# Patient Record
Sex: Female | Born: 1965 | Race: Black or African American | Hispanic: No | Marital: Single | Smoking: Current every day smoker
Health system: Southern US, Community
[De-identification: ages and names within clinical notes are randomized; demographics above are authoritative.]

## PROBLEM LIST (undated history)

## (undated) DIAGNOSIS — Z860100 Personal history of colon polyps, unspecified: Secondary | ICD-10-CM

## (undated) DIAGNOSIS — G709 Myoneural disorder, unspecified: Secondary | ICD-10-CM

## (undated) DIAGNOSIS — K219 Gastro-esophageal reflux disease without esophagitis: Secondary | ICD-10-CM

## (undated) DIAGNOSIS — H9192 Unspecified hearing loss, left ear: Secondary | ICD-10-CM

## (undated) DIAGNOSIS — E559 Vitamin D deficiency, unspecified: Secondary | ICD-10-CM

## (undated) DIAGNOSIS — E538 Deficiency of other specified B group vitamins: Secondary | ICD-10-CM

## (undated) DIAGNOSIS — IMO0001 Reserved for inherently not codable concepts without codable children: Secondary | ICD-10-CM

## (undated) HISTORY — DX: Vitamin D deficiency, unspecified: E55.9

## (undated) HISTORY — DX: Deficiency of other specified B group vitamins: E53.8

## (undated) HISTORY — PX: BUNIONECTOMY: SHX129

## (undated) HISTORY — PX: BREAST EXCISIONAL BIOPSY: SUR124

## (undated) HISTORY — PX: EYE SURGERY: SHX253

## (undated) HISTORY — PX: TUBAL LIGATION: SHX77

---

## 2004-01-18 ENCOUNTER — Ambulatory Visit: Payer: Self-pay | Admitting: Gastroenterology

## 2005-08-12 ENCOUNTER — Ambulatory Visit: Payer: Self-pay | Admitting: *Deleted

## 2005-09-08 ENCOUNTER — Ambulatory Visit: Payer: Self-pay | Admitting: *Deleted

## 2006-09-05 ENCOUNTER — Emergency Department: Payer: Self-pay | Admitting: Emergency Medicine

## 2006-09-07 ENCOUNTER — Emergency Department: Payer: Self-pay | Admitting: Emergency Medicine

## 2007-01-17 ENCOUNTER — Ambulatory Visit: Payer: Self-pay | Admitting: Family Medicine

## 2007-02-08 ENCOUNTER — Ambulatory Visit: Payer: Self-pay | Admitting: Gastroenterology

## 2007-11-04 ENCOUNTER — Emergency Department: Payer: Self-pay | Admitting: Emergency Medicine

## 2007-12-02 ENCOUNTER — Emergency Department: Payer: Self-pay | Admitting: Internal Medicine

## 2008-04-26 ENCOUNTER — Ambulatory Visit: Payer: Self-pay | Admitting: Family Medicine

## 2008-06-13 ENCOUNTER — Emergency Department: Payer: Self-pay | Admitting: Unknown Physician Specialty

## 2011-04-07 HISTORY — PX: FOOT ARTHROPLASTY: SHX1657

## 2011-04-07 HISTORY — PX: BREAST BIOPSY: SHX20

## 2011-07-28 ENCOUNTER — Ambulatory Visit: Payer: Self-pay

## 2011-08-24 ENCOUNTER — Ambulatory Visit: Payer: Self-pay | Admitting: General Surgery

## 2011-08-24 LAB — HCG, QUANTITATIVE, PREGNANCY: Beta Hcg, Quant.: <1 m[IU]/mL — ABNORMAL LOW

## 2011-08-25 LAB — PATHOLOGY REPORT

## 2011-08-27 DIAGNOSIS — H0230 Blepharochalasis unspecified eye, unspecified eyelid: Secondary | ICD-10-CM | POA: Insufficient documentation

## 2011-10-05 ENCOUNTER — Emergency Department: Payer: Self-pay | Admitting: Unknown Physician Specialty

## 2011-10-10 ENCOUNTER — Emergency Department: Payer: Self-pay | Admitting: Emergency Medicine

## 2011-11-17 ENCOUNTER — Ambulatory Visit: Payer: Self-pay | Admitting: Internal Medicine

## 2011-11-23 ENCOUNTER — Ambulatory Visit: Payer: Self-pay

## 2013-09-08 ENCOUNTER — Emergency Department: Payer: Self-pay | Admitting: Emergency Medicine

## 2013-09-09 LAB — BASIC METABOLIC PANEL
Anion Gap: 5 — ABNORMAL LOW (ref 7–16)
BUN: 8 mg/dL (ref 7–18)
CREATININE: 0.71 mg/dL (ref 0.60–1.30)
Calcium, Total: 9.1 mg/dL (ref 8.5–10.1)
Chloride: 108 mmol/L — ABNORMAL HIGH (ref 98–107)
Co2: 26 mmol/L (ref 21–32)
EGFR (African American): >60
EGFR (Non-African Amer.): >60
Glucose: 80 mg/dL (ref 65–99)
Osmolality: 275 (ref 275–301)
Potassium: 4.2 mmol/L (ref 3.5–5.1)
SODIUM: 139 mmol/L (ref 136–145)

## 2013-09-09 LAB — CBC WITH DIFFERENTIAL/PLATELET
Basophil #: 0.1 10*3/uL (ref 0.0–0.1)
Basophil %: 0.7 %
Eosinophil #: 0.3 10*3/uL (ref 0.0–0.7)
Eosinophil %: 2.6 %
HCT: 42.6 % (ref 35.0–47.0)
HGB: 13.8 g/dL (ref 12.0–16.0)
LYMPHS ABS: 2.6 10*3/uL (ref 1.0–3.6)
LYMPHS PCT: 22.9 %
MCH: 30.5 pg (ref 26.0–34.0)
MCHC: 32.3 g/dL (ref 32.0–36.0)
MCV: 95 fL (ref 80–100)
MONO ABS: 1.2 x10 3/mm — AB (ref 0.2–0.9)
MONOS PCT: 10.4 %
Neutrophil #: 7.1 10*3/uL — ABNORMAL HIGH (ref 1.4–6.5)
Neutrophil %: 63.4 %
PLATELETS: 346 10*3/uL (ref 150–440)
RBC: 4.51 10*6/uL (ref 3.80–5.20)
RDW: 13.9 % (ref 11.5–14.5)
WBC: 11.2 10*3/uL — ABNORMAL HIGH (ref 3.6–11.0)

## 2013-09-11 LAB — BETA STREP CULTURE(ARMC)

## 2013-11-04 ENCOUNTER — Emergency Department: Payer: Self-pay | Admitting: Emergency Medicine

## 2013-11-04 LAB — COMPREHENSIVE METABOLIC PANEL
ALT: 26 U/L
ANION GAP: 9 (ref 7–16)
Albumin: 3.6 g/dL (ref 3.4–5.0)
Alkaline Phosphatase: 77 U/L
BUN: 6 mg/dL — ABNORMAL LOW (ref 7–18)
Bilirubin,Total: 0.2 mg/dL (ref 0.2–1.0)
Calcium, Total: 8.8 mg/dL (ref 8.5–10.1)
Chloride: 111 mmol/L — ABNORMAL HIGH (ref 98–107)
Co2: 22 mmol/L (ref 21–32)
Creatinine: 0.84 mg/dL (ref 0.60–1.30)
EGFR (Non-African Amer.): >60
Glucose: 95 mg/dL (ref 65–99)
Osmolality: 281 (ref 275–301)
POTASSIUM: 4.1 mmol/L (ref 3.5–5.1)
SGOT(AST): 37 U/L (ref 15–37)
SODIUM: 142 mmol/L (ref 136–145)
TOTAL PROTEIN: 8 g/dL (ref 6.4–8.2)

## 2013-11-04 LAB — TSH: Thyroid Stimulating Horm: 0.68 u[IU]/mL

## 2013-11-04 LAB — CBC
HCT: 44 % (ref 35.0–47.0)
HGB: 15 g/dL (ref 12.0–16.0)
MCH: 32.2 pg (ref 26.0–34.0)
MCHC: 34 g/dL (ref 32.0–36.0)
MCV: 95 fL (ref 80–100)
PLATELETS: 267 10*3/uL (ref 150–440)
RBC: 4.64 10*6/uL (ref 3.80–5.20)
RDW: 15.3 % — ABNORMAL HIGH (ref 11.5–14.5)
WBC: 7.3 10*3/uL (ref 3.6–11.0)

## 2013-11-04 LAB — GC/CHLAMYDIA PROBE AMP

## 2013-11-04 LAB — MAGNESIUM: Magnesium: 2.1 mg/dL

## 2013-11-04 LAB — WET PREP, GENITAL

## 2013-11-04 LAB — HCG, QUANTITATIVE, PREGNANCY: Beta Hcg, Quant.: <1 m[IU]/mL — ABNORMAL LOW

## 2014-07-29 NOTE — Op Note (Signed)
PATIENT NAME:  Mariah Espinoza, Mariah Espinoza MR#:  [id number] DATE OF BIRTH:  06/30/1965  DATE OF PROCEDURE:  08/24/2011  PREOPERATIVE DIAGNOSES:  1. Right eyelid cyst.  2. Chronic right breast abscess.   POSTOPERATIVE DIAGNOSES:  1. Right eyelid cyst.  2. Chronic right breast abscess.   OPERATIVE PROCEDURE:  1. Excision of sebaceous cyst of the right eyelid.  2. Excision of left retroareolar ductal network.   SURGEON: Donnalee CurryJeffrey Jacques Fife, MD   ANESTHESIA: General by LMA under Dr. Rubye OaksPalmer, 26 mL 0.5% Marcaine plain.   ESTIMATED BLOOD LOSS: Minimal.   CLINICAL NOTE: This 49 year old woman has developed Espinoza significant fascial cyst about 1.3 cm in diameter producing drooping of the right eyelid. She has also had Espinoza chronic sinus formation under the left nipple with Espinoza new area of drainage in the six o'clock position. She was felt to be Espinoza candidate for excision of the retroareolar ductal network.   OPERATIVE NOTE: With the patient under adequate general anesthesia, the eyelid lesion was approached first. The area was prepped with Espinoza Betadine solution and draped. Through Espinoza small skin line incision the sebaceous cyst and its wall was excised. The defect was closed with interrupted 5-0 nylon sutures.   The left breast was then prepped with ChloraPrep and draped. The sinus at the three o'clock position at the edge of the areola was excised through an elliptical incision as part of the circumareolar incision that ran from the 2 to 6 o'clock position. The skin was incised sharply and hemostasis achieved with electrocautery. There was Espinoza very small drop of purulent material. The retroareolar tissue was primarily fibrotic. This was excised sharply and with cautery dissection. Hemostasis was with electrocautery. The sinus that had developed 2 cm below the edge of the areola at the six o'clock position was probed and found to be in continuity with the retroareolar area. This was debrided sharply. After achieving hemostasis  the deep breast tissue was approximated with interrupted 2-0 Vicryl figure-of-eight sutures. Espinoza quarter-inch Penrose drain was placed through the sinus at the six o'clock position. The skin incision was closed loosely with interrupted 4-0 Vicryl subcuticular sutures. Telfa followed by Espinoza fluff gauze dressing, Kerlix, and Ace wrap was applied. The drain was sutured to the fluffed gauze.   The patient tolerated the procedure well and was taken to the recovery room stable condition.     ____________________________ Earline MayotteJeffrey W. Desree Leap, MD jwb:bjt D: 08/25/2011 07:39:16 ET T: 08/25/2011 10:00:56 ET JOB#: [id number]  cc: Earline MayotteJeffrey W. Ardice Boyan, MD, <Dictator> Heidi Dachebecca O. Darliss Cheneyrendorff, MD Blima SingerAnn Shaver, RN Alontae Chaloux Brion AlimentW Andrzej Scully MD ELECTRONICALLY SIGNED 08/25/2011 23:31

## 2014-10-23 ENCOUNTER — Other Ambulatory Visit: Payer: Self-pay | Admitting: Family Medicine

## 2014-10-23 DIAGNOSIS — Z872 Personal history of diseases of the skin and subcutaneous tissue: Secondary | ICD-10-CM

## 2014-10-23 DIAGNOSIS — Z1231 Encounter for screening mammogram for malignant neoplasm of breast: Secondary | ICD-10-CM

## 2014-10-25 ENCOUNTER — Ambulatory Visit: Payer: Self-pay

## 2014-10-25 ENCOUNTER — Other Ambulatory Visit: Payer: Self-pay | Admitting: Family Medicine

## 2014-10-25 ENCOUNTER — Ambulatory Visit
Admission: RE | Admit: 2014-10-25 | Discharge: 2014-10-25 | Disposition: A | Discharge status: Home / Self Care | Payer: BC Managed Care – PPO | Source: Ambulatory Visit | Attending: Family Medicine | Admitting: Family Medicine

## 2014-10-25 DIAGNOSIS — L905 Scar conditions and fibrosis of skin: Secondary | ICD-10-CM | POA: Insufficient documentation

## 2014-10-25 DIAGNOSIS — Z872 Personal history of diseases of the skin and subcutaneous tissue: Secondary | ICD-10-CM

## 2014-10-25 DIAGNOSIS — Z1231 Encounter for screening mammogram for malignant neoplasm of breast: Secondary | ICD-10-CM

## 2014-10-31 DIAGNOSIS — E538 Deficiency of other specified B group vitamins: Secondary | ICD-10-CM | POA: Insufficient documentation

## 2014-10-31 DIAGNOSIS — IMO0002 Reserved for concepts with insufficient information to code with codable children: Secondary | ICD-10-CM | POA: Insufficient documentation

## 2014-10-31 DIAGNOSIS — E559 Vitamin D deficiency, unspecified: Secondary | ICD-10-CM | POA: Insufficient documentation

## 2014-11-05 ENCOUNTER — Other Ambulatory Visit: Payer: Self-pay | Admitting: Family Medicine

## 2014-11-05 DIAGNOSIS — N924 Excessive bleeding in the premenopausal period: Secondary | ICD-10-CM

## 2014-11-06 ENCOUNTER — Ambulatory Visit
Admission: RE | Admit: 2014-11-06 | Discharge: 2014-11-06 | Disposition: A | Discharge status: Home / Self Care | Payer: BC Managed Care – PPO | Source: Ambulatory Visit | Attending: Family Medicine | Admitting: Family Medicine

## 2014-11-06 DIAGNOSIS — N924 Excessive bleeding in the premenopausal period: Secondary | ICD-10-CM

## 2014-11-06 DIAGNOSIS — N926 Irregular menstruation, unspecified: Secondary | ICD-10-CM | POA: Insufficient documentation

## 2015-05-09 ENCOUNTER — Ambulatory Visit (INDEPENDENT_AMBULATORY_CARE_PROVIDER_SITE_OTHER): Payer: BC Managed Care – PPO | Admitting: Surgery

## 2015-05-09 ENCOUNTER — Ambulatory Visit
Admission: EM | Admit: 2015-05-09 | Discharge: 2015-05-09 | Disposition: A | Discharge status: Home / Self Care | Payer: BC Managed Care – PPO | Attending: Family Medicine | Admitting: Family Medicine

## 2015-05-09 ENCOUNTER — Encounter: Payer: Self-pay | Admitting: Surgery

## 2015-05-09 VITALS — BP 158/98 | HR 100 | Temp 98.0°F | Ht 63.0 in | Wt 200.0 lb

## 2015-05-09 DIAGNOSIS — J309 Allergic rhinitis, unspecified: Secondary | ICD-10-CM | POA: Insufficient documentation

## 2015-05-09 DIAGNOSIS — N611 Abscess of the breast and nipple: Secondary | ICD-10-CM

## 2015-05-09 DIAGNOSIS — L732 Hidradenitis suppurativa: Secondary | ICD-10-CM | POA: Insufficient documentation

## 2015-05-09 DIAGNOSIS — K2100 Gastro-esophageal reflux disease with esophagitis, without bleeding: Secondary | ICD-10-CM | POA: Insufficient documentation

## 2015-05-09 DIAGNOSIS — Z87898 Personal history of other specified conditions: Secondary | ICD-10-CM | POA: Insufficient documentation

## 2015-05-09 DIAGNOSIS — E782 Mixed hyperlipidemia: Secondary | ICD-10-CM | POA: Insufficient documentation

## 2015-05-09 HISTORY — DX: Gastro-esophageal reflux disease without esophagitis: K21.9

## 2015-05-09 NOTE — ED Notes (Addendum)
Started 3-4 days ago with soreness on left aerola at the 12 oclock mark. Increased in size with now cellulitis approxiamately 3 inches in size with a white core. "Ruptured and drained itself today at 1pm today". Saw PMD yesterday and put on Doxycycline. Started antibiotic today. Previous abscess same spot in 2013. Drained at the [facility]

## 2015-05-09 NOTE — ED Provider Notes (Signed)
CSN: [id number]     Arrival date & time 05/09/15  1346 History   First MD Initiated Contact with Patient 05/09/15 1503    Nurses notes were reviewed. Chief Complaint  Patient presents with  . Abscess   Left breast abscess started 4 days ago Sunday. Apparently yesterday she was concerned and she called to get medication and her PCP called in doxycycline which she don't get filled this morning she started taking one dose of antibiotic    Patient smokes family medical history not pertinent for this problem. She does have GERD. She is allergic to Septra and penicillin.  (Consider location/radiation/quality/duration/timing/severity/associated sxs/prior Treatment) Patient is a 50 y.o. female presenting with abscess. The history is provided by the patient.  Abscess Location:  Torso Torso abscess location: Left breast. 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 GERD (gastroesophageal reflux disease)    Past Surgical History  Procedure Laterality Date  . Breast biopsy Left 2013    neg   History reviewed. No pertinent family history. Social History  Substance Use Topics  . Smoking status: Current Every Day Smoker -- 0.50 packs/day    Types: Cigarettes  . Smokeless tobacco: None  . Alcohol Use: Yes     Comment: socially   OB History    No data available     Review of Systems  Constitutional: Negative for fever.  Gastrointestinal: Negative for nausea and vomiting.  All other systems reviewed and are negative.   Allergies  Penicillins and Septra  Home Medications   Prior to Admission medications   Medication Sig  Start Date End Date Taking? Authorizing Provider  calcium-vitamin D (OSCAL WITH D) 500-200 MG-UNIT tablet Take 1 tablet by mouth.   Yes Historical Provider, MD  doxycycline (DORYX) 100 MG EC tablet Take 100 mg by mouth 2 (two) times daily.   Yes Historical Provider, MD  omeprazole (PRILOSEC) 20 MG capsule Take 20 mg by mouth daily.   Yes Historical Provider, MD   Meds Ordered and Administered this Visit  Medications - No data to display  BP 150/87 mmHg  Pulse 100  Temp(Src) 96.8 F (36 C) (Tympanic)  Resp 16  Ht  (1.6 m)  Wt 199 lb (90.266 kg)  BMI 35.26 kg/m2  SpO2 98%  LMP 04/25/2015 (Approximate) No data found.   Physical Exam  Constitutional: She is oriented to person, place, and time. She appears well-developed and well-nourished.  HENT:  Head: Normocephalic and atraumatic.  Neck: Neck supple.  Neurological: She is alert and oriented to person, place, and time.  Skin: Skin is warm and dry. Rash noted.     Abscess over the lower left breast  Psychiatric: She has a normal mood and affect.    ED Course  Procedures (including critical care time)  Labs Review Labs Reviewed - No data to display  Imaging Review No results found.   Visual Acuity Review  Right Eye Distance:   Left Eye Distance:   Bilateral Distance:    Right Eye Near:   Left Eye Near:    Bilateral Near:  MDM   1. Left breast abscess    Will send patient to [facility] surgical group. She cannot give me the name of the surgeon who she saw 3 years ago when she is going to general anesthesia for treatment of breast abscess. She's only taken 1 dose of the doxycycline which explained to her 2 little too late to try to correct this abscess. Note: This dictation was prepared with Dragon dictation along with smaller phrase technology. Any transcriptional errors that result from this process are unintentional.    Hassan Rowan, MD 05/09/15 1525

## 2015-05-09 NOTE — Discharge Instructions (Signed)
Abscess °An abscess (boil or furuncle) is an infected area on or under the skin. This area is filled with yellowish-white fluid (pus) and other material (debris). °HOME CARE  °· Only take medicines as told by your doctor. °· If you were given antibiotic medicine, take it as directed. Finish the medicine even if you start to feel better. °· If gauze is used, follow your doctor's directions for changing the gauze. °· To avoid spreading the infection: °¨ Keep your abscess covered with a bandage. °¨ Wash your hands well. °¨ Do not share personal care items, towels, or whirlpools with others. °¨ Avoid skin contact with others. °· Keep your skin and clothes clean around the abscess. °· Keep all doctor visits as told. °GET HELP RIGHT AWAY IF:  °· You have more pain, puffiness (swelling), or redness in the wound site. °· You have more fluid or blood coming from the wound site. °· You have muscle aches, chills, or you feel sick. °· You have a fever. °MAKE SURE YOU:  °· Understand these instructions. °· Will watch your condition. °· Will get help right away if you are not doing well or get worse. °  °This information is not intended to replace advice given to you by your health care provider. Make sure you discuss any questions you have with your health care provider. °  °Document Released: 09/09/2007 Document Revised: 09/22/2011 Document Reviewed: 06/06/2011 °Elsevier Interactive Patient Education ©2016 [employer]vier Inc[employer] ° °

## 2015-05-09 NOTE — Progress Notes (Signed)
Surgical Consultation  05/09/2015  Mariah Espinoza is an 50 y.o. female.   [id number] abscess  HPI: pt from UC with subareolar abscess asked to be seen urgently.She had a prev abscess in same breast years ago, drained in OR. This has been present for one week . It spont drained this morning and feels better, and she has started one dose of doxycycline this morning. Denies f/c. Not diabetic  Past Medical History  Diagnosis Date  . GERD (gastroesophageal reflux disease)     Past Surgical History  Procedure Laterality Date  . Breast biopsy Left 2013    neg    No family history on file.  Social History:  reports that she has been smoking Cigarettes.  She has been smoking about 0.50 packs per day. She does not have any smokeless tobacco history on file. She reports that she drinks alcohol. Her drug history is not on file.  Allergies:  Allergies  Allergen Reactions  . Penicillins Hives  . Septra [Sulfamethoxazole-Trimethoprim] Nausea And Vomiting    Medications reviewed.   Review of Systems:   Review of Systems  Constitutional: Negative.   HENT: Negative.   Eyes: Negative.   Respiratory: Negative.   Cardiovascular: Negative.   Gastrointestinal: Negative.   Genitourinary: Negative.   Musculoskeletal: Negative.   Skin: Negative.   Neurological: Negative.   Endo/Heme/Allergies: Negative.   Psychiatric/Behavioral: Negative.      Physical Exam:  LMP 04/25/2015 (Approximate)  Physical Exam  Constitutional: She is oriented to person, place, and time and well-developed, well-nourished, and in no distress. No distress.  HENT:  Head: Normocephalic and atraumatic.  Eyes: Pupils are equal, round, and reactive to light. Right eye exhibits no discharge. Left eye exhibits no discharge. No scleral icterus.  Neck: Normal range of motion.  Cardiovascular: Normal rate, regular rhythm and normal heart sounds.   Pulmonary/Chest: Effort normal.  Abdominal: Soft. She exhibits  no distension. There is no tenderness. There is no rebound.  Musculoskeletal: Normal range of motion. She exhibits no edema.  Lymphadenopathy:    She has no cervical adenopathy.  Neurological: She is alert and oriented to person, place, and time.  Skin: Skin is warm and dry. She is not diaphoretic. There is erythema.  Left breast:induration and min erythema around open wound 4mm in size  Psychiatric: Mood and affect normal.  Vitals reviewed.     No results found for this or any previous visit (from the past 48 hour(s)). No results found.  Assessment/Plan:  Spont drained abscess. Placed 1/4" gauze. Rec remove gauze in am. Had neg mammo 7/16. rec cont doxy and f/u next week.  Lattie Haw, MD, FACS

## 2015-05-09 NOTE — ED Notes (Signed)
Patient taken ambulatory to [facility] on 2nd Floor of [facility]. This nurse contacted the office and could see and evaluate patient now

## 2015-05-09 NOTE — Patient Instructions (Addendum)
We will have you follow-up next week. Please call as soon as you have your schedule and we will make this appointment for you.  Continue your Doxycycline until it is completely gone.  If you develop a fever >100.5 or develop nausea and vomiting, please proceed to the nearest Emergency Room.

## 2015-07-12 NOTE — Discharge Instructions (Signed)

## 2015-07-16 ENCOUNTER — Ambulatory Visit: Payer: BC Managed Care – PPO | Admitting: Anesthesiology

## 2015-07-16 ENCOUNTER — Encounter
Admission: RE | Disposition: A | Discharge status: Home / Self Care | Payer: Self-pay | Source: Ambulatory Visit | Attending: Gastroenterology

## 2015-07-16 ENCOUNTER — Ambulatory Visit
Admission: RE | Admit: 2015-07-16 | Discharge: 2015-07-16 | Disposition: A | Discharge status: Home / Self Care | Payer: BC Managed Care – PPO | Source: Ambulatory Visit | Attending: Gastroenterology | Admitting: Gastroenterology

## 2015-07-16 DIAGNOSIS — Z1211 Encounter for screening for malignant neoplasm of colon: Secondary | ICD-10-CM | POA: Insufficient documentation

## 2015-07-16 DIAGNOSIS — Z9851 Tubal ligation status: Secondary | ICD-10-CM | POA: Diagnosis not present

## 2015-07-16 DIAGNOSIS — E785 Hyperlipidemia, unspecified: Secondary | ICD-10-CM | POA: Insufficient documentation

## 2015-07-16 DIAGNOSIS — Z79899 Other long term (current) drug therapy: Secondary | ICD-10-CM | POA: Diagnosis not present

## 2015-07-16 DIAGNOSIS — Z880 Allergy status to penicillin: Secondary | ICD-10-CM | POA: Diagnosis not present

## 2015-07-16 DIAGNOSIS — Z86010 Personal history of colonic polyps: Secondary | ICD-10-CM | POA: Insufficient documentation

## 2015-07-16 DIAGNOSIS — K219 Gastro-esophageal reflux disease without esophagitis: Secondary | ICD-10-CM | POA: Diagnosis not present

## 2015-07-16 DIAGNOSIS — J309 Allergic rhinitis, unspecified: Secondary | ICD-10-CM | POA: Diagnosis not present

## 2015-07-16 DIAGNOSIS — F17210 Nicotine dependence, cigarettes, uncomplicated: Secondary | ICD-10-CM | POA: Insufficient documentation

## 2015-07-16 DIAGNOSIS — Z882 Allergy status to sulfonamides status: Secondary | ICD-10-CM | POA: Diagnosis not present

## 2015-07-16 HISTORY — PX: COLONOSCOPY: SHX5424

## 2015-07-16 HISTORY — DX: Unspecified hearing loss, left ear: H91.92

## 2015-07-16 HISTORY — DX: Personal history of colonic polyps: Z86.010

## 2015-07-16 HISTORY — DX: Myoneural disorder, unspecified: G70.9

## 2015-07-16 HISTORY — DX: Personal history of colon polyps, unspecified: Z86.0100

## 2015-07-16 HISTORY — DX: Reserved for inherently not codable concepts without codable children: IMO0001

## 2015-07-16 HISTORY — PX: ESOPHAGOGASTRODUODENOSCOPY: SHX5428

## 2015-07-16 SURGERY — COLONOSCOPY
Anesthesia: Monitor Anesthesia Care | Site: Rectum | Wound class: Dirty or Infected

## 2015-07-16 MED ORDER — GLYCOPYRROLATE 0.2 MG/ML IJ SOLN
INTRAMUSCULAR | Status: DC | PRN
  Administered 2015-07-16: 0.2 mg via INTRAVENOUS

## 2015-07-16 MED ORDER — LACTATED RINGERS IV SOLN: 500.0000 mL | INTRAVENOUS | Status: DC

## 2015-07-16 MED ORDER — PROPOFOL 10 MG/ML IV BOLUS
INTRAVENOUS | Status: DC | PRN
  Administered 2015-07-16: 20 mg via INTRAVENOUS
  Administered 2015-07-16 (×2): 30 mg via INTRAVENOUS
  Administered 2015-07-16: 70 mg via INTRAVENOUS
  Administered 2015-07-16 (×2): 20 mg via INTRAVENOUS
  Administered 2015-07-16: 30 mg via INTRAVENOUS
  Administered 2015-07-16: 20 mg via INTRAVENOUS
  Administered 2015-07-16: 30 mg via INTRAVENOUS
  Administered 2015-07-16: 20 mg via INTRAVENOUS

## 2015-07-16 MED ORDER — LACTATED RINGERS IV SOLN
INTRAVENOUS | Status: DC
  Administered 2015-07-16: 12:00:00 via INTRAVENOUS

## 2015-07-16 MED ORDER — LIDOCAINE HCL (CARDIAC) 20 MG/ML IV SOLN
INTRAVENOUS | Status: DC | PRN
  Administered 2015-07-16: 40 mg via INTRAVENOUS

## 2015-07-16 MED ORDER — STERILE WATER FOR IRRIGATION IR SOLN
Status: DC | PRN
  Administered 2015-07-16: 13:00:00

## 2015-07-16 SURGICAL SUPPLY — 41 items
BALLN DILATOR 10-12 8 (BALLOONS)
BALLN DILATOR 12-15 8 (BALLOONS)
BALLN DILATOR 15-18 8 (BALLOONS)
BALLN DILATOR CRE 0-12 8 (BALLOONS)
BALLN DILATOR ESOPH 8 10 CRE (MISCELLANEOUS) IMPLANT
BALLOON DILATOR 12-15 8 (BALLOONS) IMPLANT
BALLOON DILATOR 15-18 8 (BALLOONS) IMPLANT
BALLOON DILATOR CRE 0-12 8 (BALLOONS) IMPLANT
BLOCK BITE 60FR ADLT L/F GRN (MISCELLANEOUS) ×4 IMPLANT
CANISTER SUCT 1200ML W/VALVE (MISCELLANEOUS) ×4 IMPLANT
FCP ESCP3.2XJMB 240X2.8X (MISCELLANEOUS)
FORCEPS BIOP RAD 4 LRG CAP 4 (CUTTING FORCEPS) ×4 IMPLANT
FORCEPS BIOP RJ4 240 W/NDL (MISCELLANEOUS)
FORCEPS ESCP3.2XJMB 240X2.8X (MISCELLANEOUS) IMPLANT
GOWN CVR UNV OPN BCK APRN NK (MISCELLANEOUS) ×2 IMPLANT
GOWN ISOL THUMB LOOP REG UNIV (MISCELLANEOUS) ×2
GOWN STRL REUS W/ TWL LRG LVL3 (GOWN DISPOSABLE) ×2 IMPLANT
GOWN STRL REUS W/TWL LRG LVL3 (GOWN DISPOSABLE) ×2
HEMOCLIP INSTINCT (CLIP) IMPLANT
INJECTOR VARIJECT VIN23 (MISCELLANEOUS) IMPLANT
KIT CO2 TUBING (TUBING) IMPLANT
KIT DEFENDO VALVE AND CONN (KITS) IMPLANT
KIT ENDO PROCEDURE OLY (KITS) ×4 IMPLANT
LIGATOR MULTIBAND 6SHOOTER MBL (MISCELLANEOUS) IMPLANT
MARKER SPOT ENDO TATTOO 5ML (MISCELLANEOUS) IMPLANT
PAD GROUND ADULT SPLIT (MISCELLANEOUS) IMPLANT
SNARE SHORT THROW 13M SML OVAL (MISCELLANEOUS) IMPLANT
SNARE SHORT THROW 30M LRG OVAL (MISCELLANEOUS) IMPLANT
SPOT EX ENDOSCOPIC TATTOO (MISCELLANEOUS)
SUCTION POLY TRAP 4CHAMBER (MISCELLANEOUS) IMPLANT
SYR INFLATION 60ML (SYRINGE) IMPLANT
TRAP SUCTION POLY (MISCELLANEOUS) IMPLANT
TUBING CONN 6MMX3.1M (TUBING)
TUBING SUCTION CONN 0.25 STRL (TUBING) IMPLANT
UNDERPAD 30X60 958B10 (PK) (MISCELLANEOUS) IMPLANT
VALVE BIOPSY ENDO (VALVE) IMPLANT
VARIJECT INJECTOR VIN23 (MISCELLANEOUS)
WATER AUXILLARY (MISCELLANEOUS) IMPLANT
WATER STERILE IRR 250ML POUR (IV SOLUTION) ×4 IMPLANT
WATER STERILE IRR 500ML POUR (IV SOLUTION) IMPLANT
WIRE CRE 18-20MM 8CM F G (MISCELLANEOUS) IMPLANT

## 2015-07-16 NOTE — Anesthesia Preprocedure Evaluation (Signed)
Anesthesia Evaluation  Patient identified by MRN, date of birth, ID band Patient awake    Reviewed: Allergy & Precautions, H&P , NPO status , Patient's Chart, lab work & pertinent test results, reviewed documented beta blocker date and time   Airway Mallampati: II  TM Distance: >3 FB Neck ROM: full    Dental no notable dental hx.    Pulmonary Current Smoker,    Pulmonary exam normal breath sounds clear to auscultation       Cardiovascular Exercise Tolerance: Good negative cardio ROS   Rhythm:regular Rate:Normal     Neuro/Psych negative psych ROS   GI/Hepatic Neg liver ROS, GERD  Medicated and Controlled,  Endo/Other  negative endocrine ROS  Renal/GU negative Renal ROS  negative genitourinary   Musculoskeletal   Abdominal   Peds  Hematology negative hematology ROS (+)   Anesthesia Other Findings   Reproductive/Obstetrics negative OB ROS                             Anesthesia Physical Anesthesia Plan  ASA: III  Anesthesia Plan: MAC   Post-op Pain Management:    Induction:   Airway Management Planned:   Additional Equipment:   Intra-op Plan:   Post-operative Plan:   Informed Consent: I have reviewed the patients History and Physical, chart, labs and discussed the procedure including the risks, benefits and alternatives for the proposed anesthesia with the patient or authorized representative who has indicated his/her understanding and acceptance.     Plan Discussed with: CRNA  Anesthesia Plan Comments:         Anesthesia Quick Evaluation

## 2015-07-16 NOTE — H&P (Signed)
Primary Care Physician:  No PCP Per Patient Primary Gastroenterologist:  Dr. Bluford Kaufmannh  Pre-Procedure History & Physical: HPI:  Mariah Espinoza is a 50 y.o. female is here for an EGD/colonoscopy   Past Medical History  Diagnosis Date  . GERD (gastroesophageal reflux disease)   . Vitamin B 12 deficiency   . Vitamin D deficiency   . Hx of colonic polyp   . Shortness of breath dyspnea   . Hearing loss in left ear   . 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 Breast biopsy Left 2013    neg  . Foot arthroplasty Bilateral 2013  . Tubal ligation      Prior to Admission medications   Medication Sig Start Date End Date Taking? Authorizing Provider  Cholecalciferol (VITAMIN D3) 1000 units CAPS Take 1 capsule by mouth 1 day or 1 dose. AM 11/02/14 11/02/15 Yes Historical Provider, MD  cyanocobalamin (,VITAMIN B-12,) 1000 MCG/ML injection Inject 1 mg into the muscle every 30 (thirty) days.  11/02/14 10/28/15 Yes Historical Provider, MD  magnesium oxide (MAG-OX) 400 MG tablet Take 1 tablet by mouth daily. AM 11/02/14 11/02/15 Yes Historical Provider, MD  nystatin cream (MYCOSTATIN) Apply 1 application topically 2 (two) times daily. 05/15/09  Yes Historical Provider, MD  omeprazole (PRILOSEC) 20 MG capsule Take 20 mg by mouth daily. PM   Yes Historical Provider, MD  ranitidine (ZANTAC) 150 MG capsule Take 150 mg by mouth 2 (two) times daily.   Yes Historical Provider, MD    Allergies as of 06/18/2015 - Review Complete 05/09/2015  Allergen Reaction Noted  . Sulfa antibiotics Anaphylaxis 05/09/2015  . Penicillins Hives 05/09/2015  . Septra [sulfamethoxazole-trimethoprim] Nausea And Vomiting 05/09/2015    No family history on file.  Social History   Social History  . Marital Status: Single    Spouse Name: N/A  . Number of Children: N/A  . Years of Education: N/A   Occupational History   . Not on file.   Social History Main Topics  . Smoking status: Current Every Day Smoker -- 0.25 packs/day for 30 years    Types: Cigarettes  . Smokeless tobacco: Never Used  . Alcohol Use: 4.2 oz/week    7 Glasses of wine per week     Comment: socially-  . Drug Use: No  . Sexual Activity: Not on file   Other Topics Concern  . Not on file   Social History Narrative    Review of Systems: See HPI, otherwise negative ROS  Physical Exam: BP 121/83 mmHg  Pulse 88  Temp(Src) 98.1 F (36.7 C) (Tympanic)  Resp 16  Ht 5\' 3"  (1.6 m)  Wt 88.905 kg (196 lb)  BMI 34.73 kg/m2  SpO2 97%  LMP 04/25/2015 (Approximate) General:   Alert,  pleasant and cooperative in NAD Head:  Normocephalic and atraumatic. Neck:  Supple; no masses or thyromegaly. Lungs:  Clear throughout to auscultation.    Heart:  Regular rate and rhythm. Abdomen:  Soft, nontender and nondistended. Normal bowel sounds, without guarding, and without rebound.   Neurologic:  Alert and  oriented x4;  grossly normal neurologically.  Impression/Plan: Mariah JackKimberly A Thane is here for an EGD/colonoscopy to be performed for hx of colon polyps, GERD Risks, benefits, limitations, and alternatives regarding  EGD/colonoscopy have been reviewed with the patient.  Questions have been answered.  All parties agreeable.  Deisha Stull, Ezzard Standing, MD  07/16/2015, 11:52 AM

## 2015-07-16 NOTE — Op Note (Signed)
[facility] Patient Name: Mariah Espinoza Procedure Date: 07/16/2015 12:31 PM MRN: [id number] Account #: [id number] Date of Birth: Oct 07, 1965 Admit Type: Outpatient Age: 50 Room: [facility] OR ROOM 01 Gender: Female Note Status: Finalized Procedure:            Upper GI endoscopy Indications:          Suspected esophageal reflux Providers:            Ezzard Standing. Bluford Kaufmann, MD Referring MD:         Gretchen Portela. Komives (Referring MD) Medicines:            Monitored Anesthesia Care Complications:        No immediate complications. Procedure:            Pre-Anesthesia Assessment:                       - Prior to the procedure, a History and Physical was                        performed, and patient medications, allergies and                        sensitivities were reviewed. The patient's tolerance of                        previous anesthesia was reviewed.                       - The risks and benefits of the procedure and the                        sedation options and risks were discussed with the                        patient. All questions were answered and informed                        consent was obtained.                       - After reviewing the risks and benefits, the patient                        was deemed in satisfactory condition to undergo the                        procedure.                       After obtaining informed consent, the endoscope was                        passed under direct vision. Throughout the procedure,                        the patient's blood pressure, pulse, and oxygen                        saturations were monitored continuously. The was  introduced through the mouth, and advanced to the                        second part of duodenum. The upper GI endoscopy was                        accomplished without difficulty. The patient tolerated                        the procedure well. Findings:  LA Grade A (one or more mucosal breaks less than 5 mm, not extending       between tops of 2 mucosal folds) esophagitis was found. Biopsies were       taken with a cold forceps for histology.      The exam was otherwise without abnormality.      A small hiatal hernia was present.      The exam was otherwise without abnormality.      Localized mildly erythematous mucosa was found in the duodenal bulb.      The exam was otherwise without abnormality. Impression:           - LA Grade A reflux esophagitis. Biopsied.                       - The examination was otherwise normal.                       - Small hiatal hernia.                       - The examination was otherwise normal.                       - Erythematous duodenopathy.                       - The examination was otherwise normal. Recommendation:       - Discharge patient to home.                       - Observe patient's clinical course.                       - Continue present medications.                       - Await pathology results.                       - The findings and recommendations were discussed with                        the patient. Procedure Code(s):    --- Professional ---                       [phone], Esophagogastroduodenoscopy, flexible, transoral;                        with biopsy, single or multiple Diagnosis Code(s):    --- Professional ---                       K21.0, Gastro-esophageal reflux disease with esophagitis  K44.9, Diaphragmatic hernia without obstruction or                        gangrene                       K31.89, Other diseases of stomach and duodenum CPT copyright 2016 American Medical Association. All rights reserved. The codes documented in this report are preliminary and upon coder review may  be revised to meet current compliance requirements. Wallace Cullens, MD 07/16/2015 12:46:34 PM This report has been signed electronically. Number of Addenda: 0 Note Initiated  On: 07/16/2015 12:31 PM      [facility]

## 2015-07-16 NOTE — Anesthesia Procedure Notes (Signed)
Procedure Name: MAC Performed by: Yailen Zemaitis Pre-anesthesia Checklist: Patient identified, Emergency Drugs available, Suction available, Timeout performed and Patient being monitored Patient Re-evaluated:Patient Re-evaluated prior to inductionOxygen Delivery Method: Nasal cannula Placement Confirmation: positive ETCO2       

## 2015-07-16 NOTE — Transfer of Care (Signed)
Immediate Anesthesia Transfer of Care Note  Patient: Mariah JackKimberly A Viger  Procedure(s) Performed: Procedure(s): COLONOSCOPY (N/A) ESOPHAGOGASTRODUODENOSCOPY (EGD) (N/A)  Patient Location: PACU  Anesthesia Type: MAC  Level of Consciousness: awake, alert  and patient cooperative  Airway and Oxygen Therapy: Patient Spontanous Breathing and Patient connected to supplemental oxygen  Post-op Assessment: Post-op Vital signs reviewed, Patient's Cardiovascular Status Stable, Respiratory Function Stable, Patent Airway and No signs of Nausea or vomiting  Post-op Vital Signs: Reviewed and stable  Complications: No apparent anesthesia complications

## 2015-07-16 NOTE — Anesthesia Postprocedure Evaluation (Signed)
Anesthesia Post Note  Patient: Mariah Espinoza  Procedure(s) Performed: Procedure(s) (LRB): COLONOSCOPY (N/A) ESOPHAGOGASTRODUODENOSCOPY (EGD) (N/A)  Patient location during evaluation: PACU Anesthesia Type: MAC Level of consciousness: awake and alert Pain management: pain level controlled Vital Signs Assessment: post-procedure vital signs reviewed and stable Respiratory status: spontaneous breathing, nonlabored ventilation and respiratory function stable Cardiovascular status: blood pressure returned to baseline and stable Postop Assessment: no signs of nausea or vomiting Anesthetic complications: no    Antonino Nienhuis D Luke Rigsbee

## 2015-07-16 NOTE — Op Note (Signed)
[facility] Gastroenterology Patient Name: Mariah Espinoza Procedure Date: 07/16/2015 12:32 PM MRN: [id number] Account #: [id number] Date of Birth: 1965-09-05 Admit Type: Outpatient Age: 50 Room: [facility] OR ROOM 01 Gender: Female Note Status: Finalized Procedure:            Colonoscopy Indications:          Personal history of colonic polyps, Hx of hyperplastic                        polyps Providers:            Ezzard Standing. Bluford Kaufmann, MD Medicines:            Monitored Anesthesia Care Complications:        No immediate complications. Procedure:            Pre-Anesthesia Assessment:                       - Prior to the procedure, a History and Physical was                        performed, and patient medications, allergies and                        sensitivities were reviewed. The patient's tolerance of                        previous anesthesia was reviewed.                       - The risks and benefits of the procedure and the                        sedation options and risks were discussed with the                        patient. All questions were answered and informed                        consent was obtained.                       - After reviewing the risks and benefits, the patient                        was deemed in satisfactory condition to undergo the                        procedure.                       After obtaining informed consent, the colonoscope was                        passed under direct vision. Throughout the procedure,                        the patient's blood pressure, pulse, and oxygen                        saturations were monitored continuously. The was  introduced through the anus and advanced to the the                        cecum, identified by appendiceal orifice and ileocecal                        valve. The colonoscopy was performed without                        difficulty. The patient tolerated the  procedure well.                        The quality of the bowel preparation was good. Findings:      The colon (entire examined portion) appeared normal. Impression:           - The entire examined colon is normal.                       - No specimens collected. Recommendation:       - Discharge patient to home.                       - Repeat colonoscopy in 10 years for surveillance.                       - The findings and recommendations were discussed with                        the patient. Procedure Code(s):    --- Professional ---                       [phone], Colonoscopy, flexible; diagnostic, including                        collection of specimen(s) by brushing or washing, when                        performed (separate procedure) Diagnosis Code(s):    --- Professional ---                       Z86.010, Personal history of colonic polyps CPT copyright 2016 American Medical Association. All rights reserved. The codes documented in this report are preliminary and upon coder review may  be revised to meet current compliance requirements. Wallace CullensPaul Y Efren Kross, MD 07/16/2015 12:56:00 PM This report has been signed electronically. Number of Addenda: 0 Note Initiated On: 07/16/2015 12:32 PM Scope Withdrawal Time: 0 hours 3 minutes 57 seconds  Total Procedure Duration: 0 hours 6 minutes 55 seconds       [facility]

## 2015-07-17 ENCOUNTER — Encounter: Payer: Self-pay | Admitting: Gastroenterology

## 2015-07-18 LAB — SURGICAL PATHOLOGY

## 2016-11-26 IMAGING — MG MM DIAG BREAST TOMO BILATERAL
8 of 12 series · 8 of 28 positions shown · non-contrast
Comparison: Previous exam(s).

CLINICAL DATA: Status post benign biopsy left breast in 6987

EXAM:
DIGITAL DIAGNOSTIC BILATERAL MAMMOGRAM WITH 3D TOMOSYNTHESIS AND CAD

[L CC]
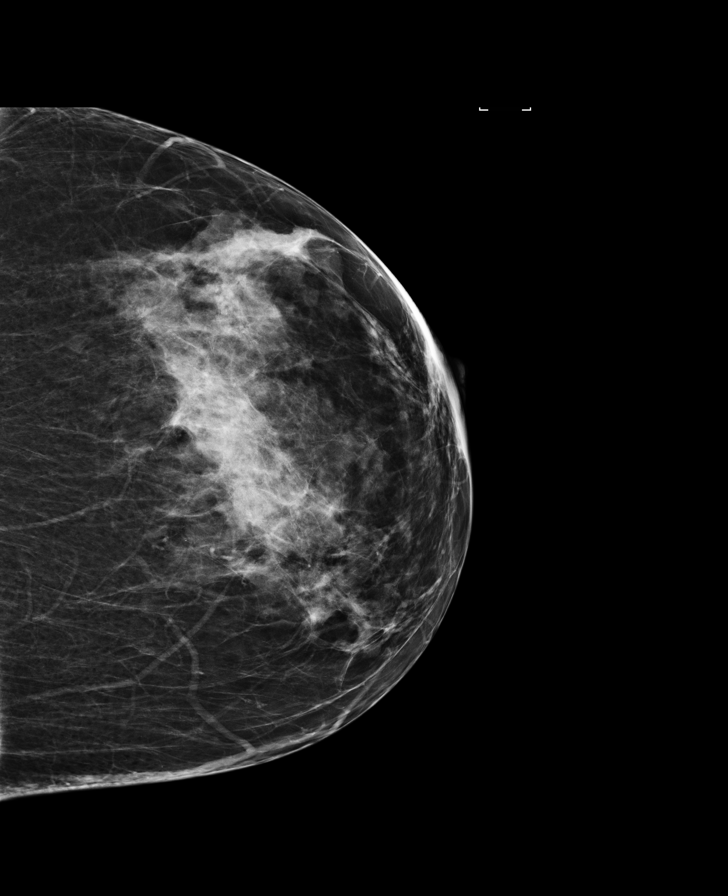

[R MLO synth-2D]
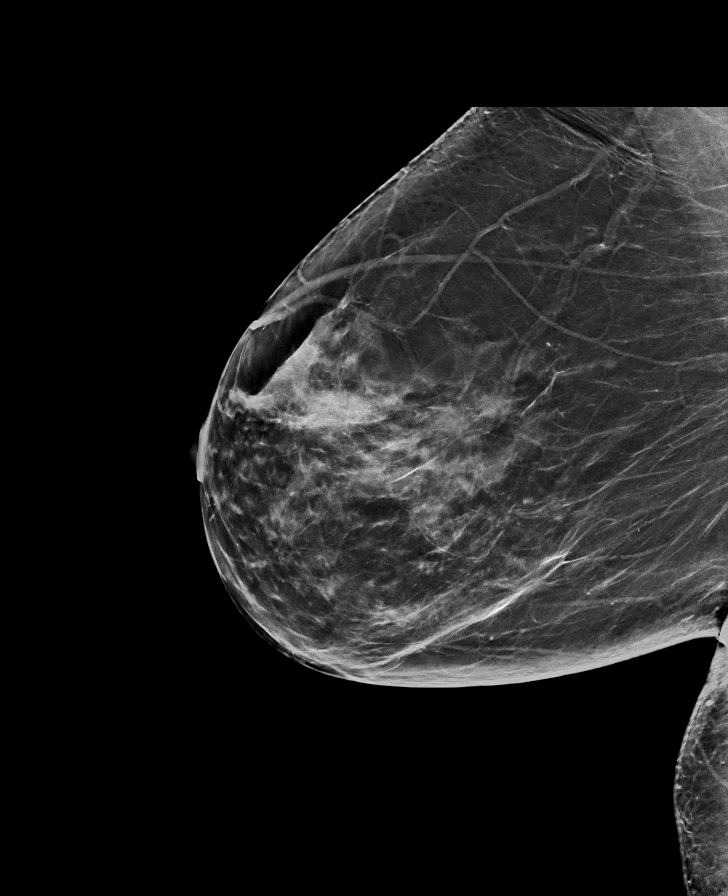

[R CC]
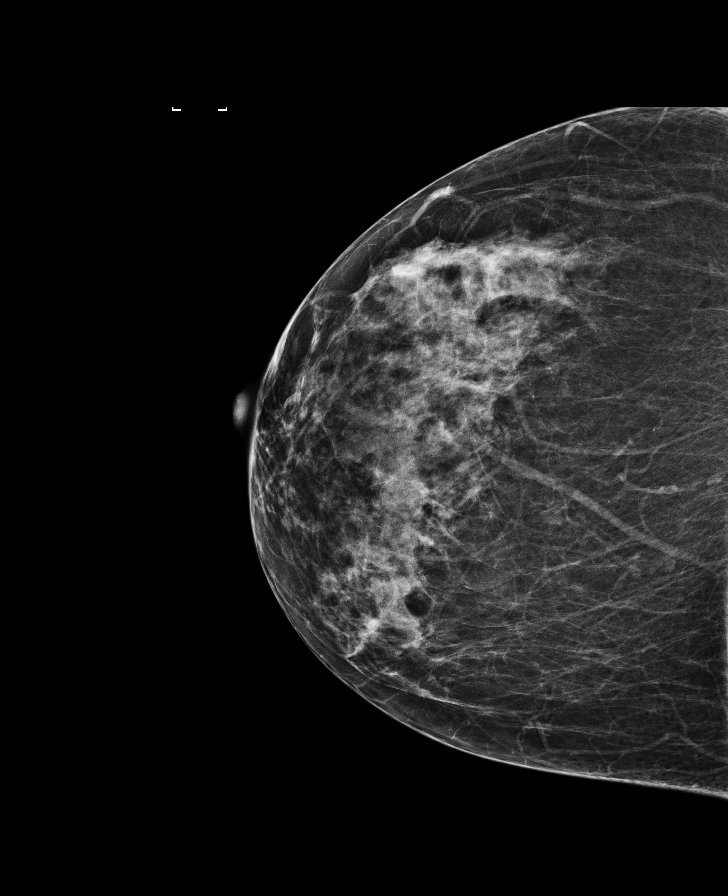

[L MLO]
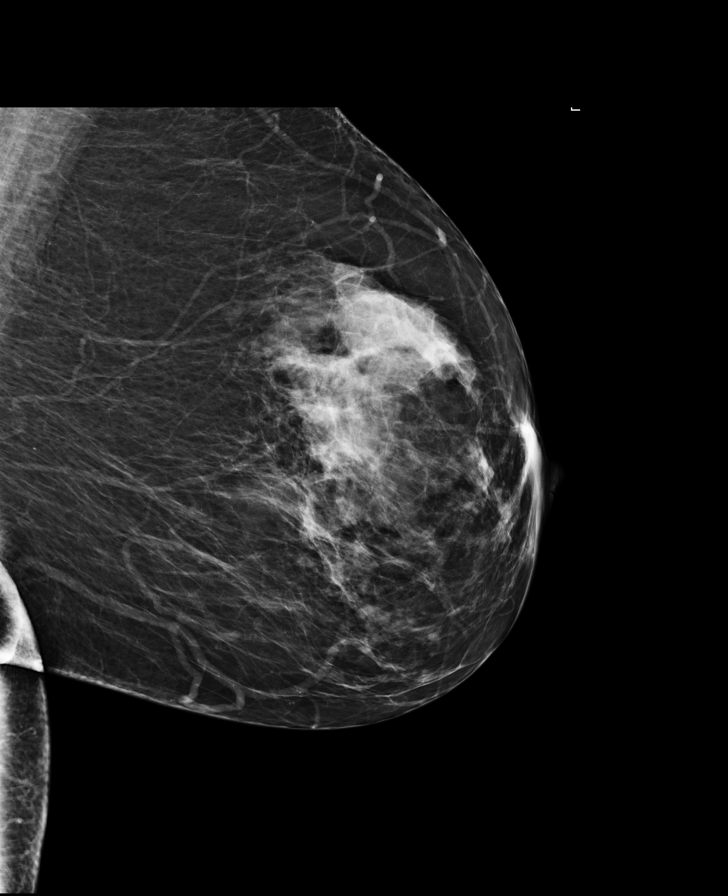

[R CC synth-2D]
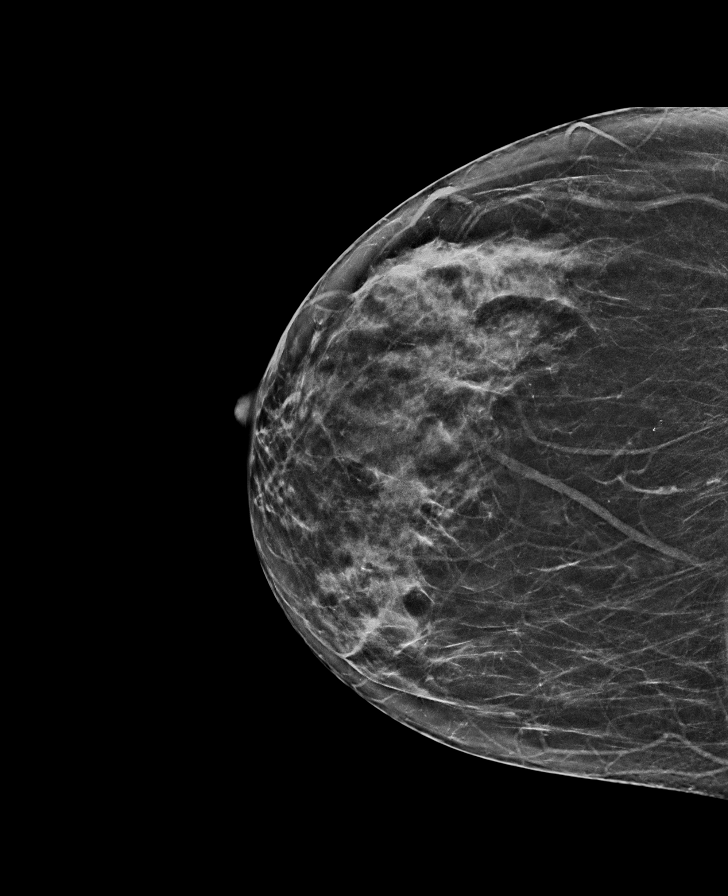

[R MLO]
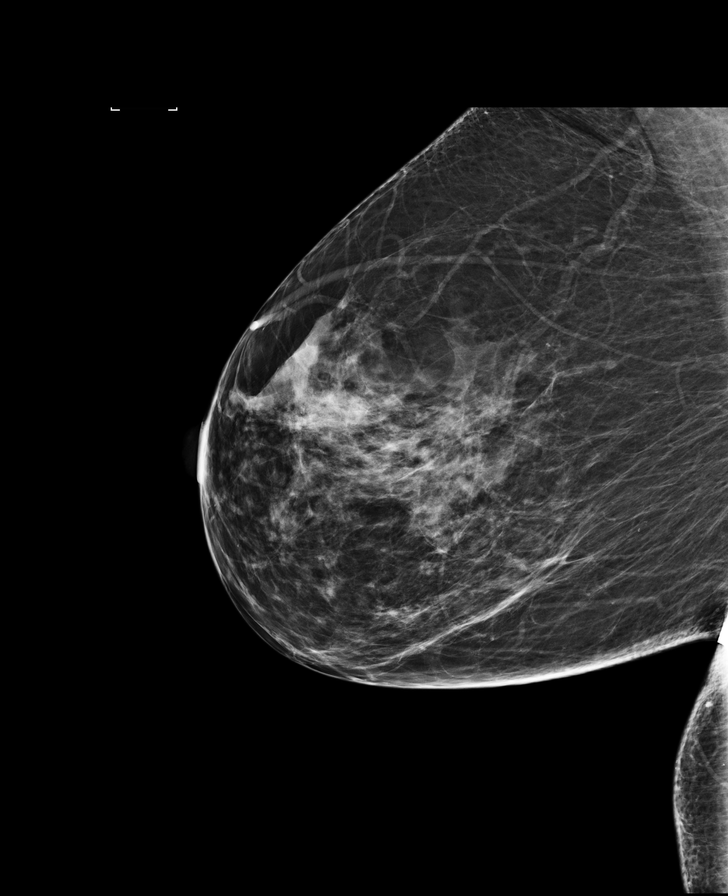

[L MLO synth-2D]
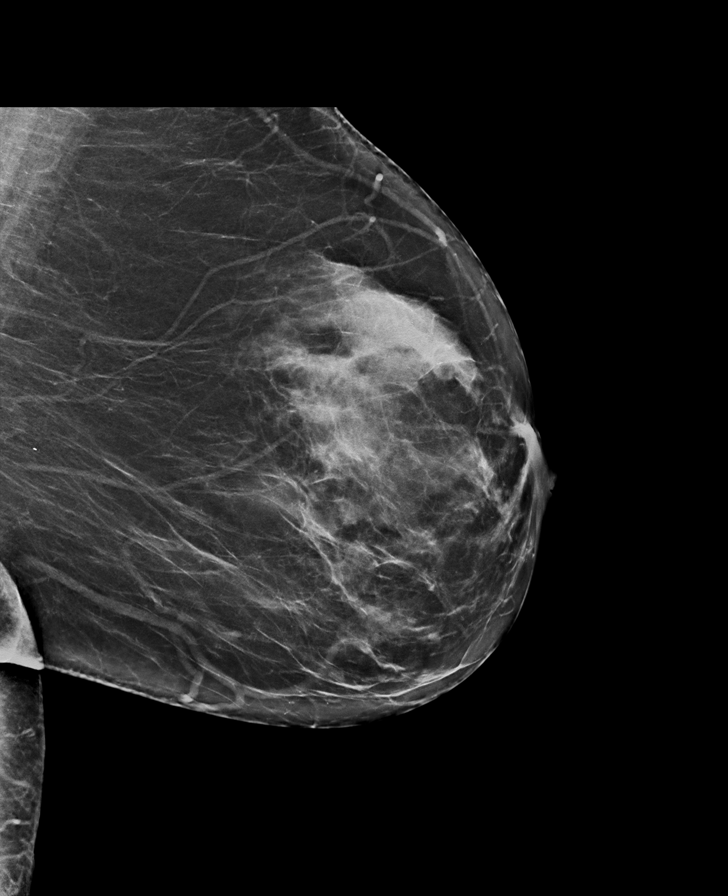

[L CC synth-2D]
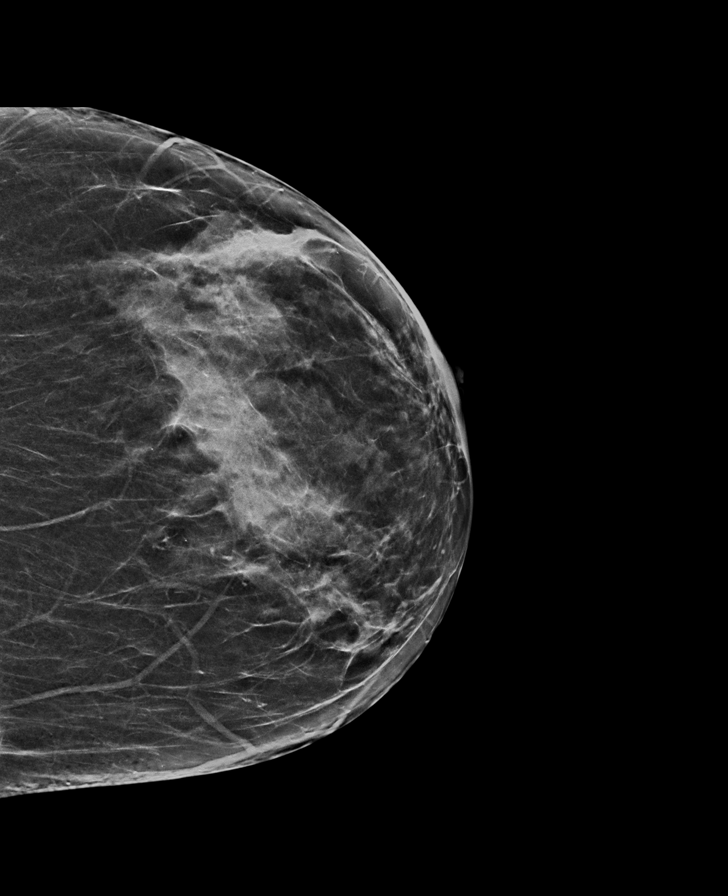

[8 of 28 positions shown; findings below may reference images not displayed]

ACR Breast Density Category c: The breast tissue is heterogeneously
dense, which may obscure small masses.
FINDINGS: Cc and MLO views of bilateral breasts are submitted. No suspicious
abnormalities identified bilaterally.

Mammographic images were processed with CAD.
IMPRESSION: Negative.

RECOMMENDATION:
Routine screening mammogram in 1 year.

I have discussed the findings and recommendations with the patient.
Results were also provided in writing at the conclusion of the
visit. If applicable, a reminder letter will be sent to the patient
regarding the next appointment.

BI-RADS CATEGORY  1: Negative.

## 2017-08-05 ENCOUNTER — Other Ambulatory Visit: Payer: Self-pay | Admitting: Family Medicine

## 2017-08-05 DIAGNOSIS — R102 Pelvic and perineal pain: Secondary | ICD-10-CM

## 2018-06-10 ENCOUNTER — Other Ambulatory Visit: Payer: Self-pay | Admitting: Family Medicine

## 2018-06-10 DIAGNOSIS — N950 Postmenopausal bleeding: Secondary | ICD-10-CM

## 2018-06-10 DIAGNOSIS — Z1231 Encounter for screening mammogram for malignant neoplasm of breast: Secondary | ICD-10-CM

## 2018-06-15 ENCOUNTER — Ambulatory Visit
Admission: RE | Admit: 2018-06-15 | Discharge: 2018-06-15 | Disposition: A | Discharge status: Home / Self Care | Payer: BC Managed Care – PPO | Source: Ambulatory Visit | Attending: Family Medicine | Admitting: Family Medicine

## 2018-06-15 ENCOUNTER — Other Ambulatory Visit: Payer: Self-pay

## 2018-06-15 ENCOUNTER — Encounter (INDEPENDENT_AMBULATORY_CARE_PROVIDER_SITE_OTHER): Payer: Self-pay

## 2018-06-15 DIAGNOSIS — R102 Pelvic and perineal pain: Secondary | ICD-10-CM | POA: Diagnosis present

## 2018-07-05 ENCOUNTER — Ambulatory Visit: Payer: BC Managed Care – PPO

## 2018-09-26 ENCOUNTER — Inpatient Hospital Stay: Admission: RE | Admit: 2018-09-26 | Payer: BC Managed Care – PPO | Source: Ambulatory Visit

## 2018-12-07 ENCOUNTER — Other Ambulatory Visit: Payer: Self-pay | Admitting: Family Medicine

## 2018-12-07 DIAGNOSIS — M25511 Pain in right shoulder: Secondary | ICD-10-CM

## 2018-12-24 ENCOUNTER — Ambulatory Visit: Payer: BC Managed Care – PPO

## 2019-02-17 ENCOUNTER — Encounter: Payer: Self-pay | Admitting: Emergency Medicine

## 2019-02-17 ENCOUNTER — Ambulatory Visit
Admission: EM | Admit: 2019-02-17 | Discharge: 2019-02-17 | Disposition: A | Discharge status: Home / Self Care | Payer: BC Managed Care – PPO | Attending: Emergency Medicine | Admitting: Emergency Medicine

## 2019-02-17 ENCOUNTER — Other Ambulatory Visit: Payer: Self-pay

## 2019-02-17 ENCOUNTER — Ambulatory Visit (INDEPENDENT_AMBULATORY_CARE_PROVIDER_SITE_OTHER): Payer: BC Managed Care – PPO

## 2019-02-17 DIAGNOSIS — R0789 Other chest pain: Secondary | ICD-10-CM

## 2019-02-17 DIAGNOSIS — X500XXA Overexertion from strenuous movement or load, initial encounter: Secondary | ICD-10-CM | POA: Diagnosis not present

## 2019-02-17 DIAGNOSIS — S20212A Contusion of left front wall of thorax, initial encounter: Secondary | ICD-10-CM

## 2019-02-17 MED ORDER — MELOXICAM 15 MG PO TABS: 15.0000 mg | ORAL_TABLET | Freq: Every day | ORAL | 0 refills | Status: AC

## 2019-02-17 MED ORDER — TIZANIDINE HCL 4 MG PO TABS: 4.0000 mg | ORAL_TABLET | Freq: Three times a day (TID) | ORAL | 0 refills | Status: AC | PRN

## 2019-02-17 NOTE — ED Provider Notes (Signed)
HPI  SUBJECTIVE:  Mariah Espinoza is a 53 y.o. female who presents with left anterior lower rib pain starting 2 days ago.  States that she bent forward to pick something up while sitting, felt something "pop" in her lower anterior chest followed by a sharp, constant pain.  She reports muscle spasms along her lower chest.  No coughing, wheezing, shortness of breath, fevers, bruising, abdominal pain.  She tried 2 tabs of Aleve once and some Tylenol without improvement in her symptoms.  Symptoms worse with coughing, leaning over taking a deep breath in.  She is a smoker, has history of IBS and GERD.  No history of osteoporosis, diabetes, hypertension.  MWU:[id number], Marylin Crosby, MD   Past Medical History:  Diagnosis Date  . GERD (gastroesophageal reflux disease)   . Hearing loss in left ear   . Hx of colonic polyp   . 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 Shortness of breath dyspnea   . Vitamin B 12 deficiency   . Vitamin D deficiency     Past Surgical History:  Procedure Laterality Date  . BREAST BIOPSY Left 2013   neg  . COLONOSCOPY N/A 07/16/2015   Procedure: COLONOSCOPY;  Surgeon: Wallace Cullens, MD;  Location: [facility];  Service: Gastroenterology;  Laterality: N/A;  . ESOPHAGOGASTRODUODENOSCOPY N/A 07/16/2015   Procedure: ESOPHAGOGASTRODUODENOSCOPY (EGD);  Surgeon: Wallace Cullens, MD;  Location: [facility];  Service: Gastroenterology;  Laterality: N/A;  . FOOT ARTHROPLASTY Bilateral 2013  . TUBAL LIGATION      History reviewed. No pertinent family history.  Social History   Tobacco Use  . Smoking status: Current Every Day Smoker    Packs/day: 0.25    Years: 30.00    Pack years: 7.50    Types: Cigarettes  . Smokeless tobacco: Never Used  Substance Use Topics  . Alcohol use: Yes    Alcohol/week: 7.0 standard drinks    Types: 7 Glasses of wine per week    Comment: socially-  . Drug use: No    No current  facility-administered medications for this encounter.   Current Outpatient Medications:  .  meloxicam (MOBIC) 15 MG tablet, Take 1 tablet (15 mg total) by mouth daily for 7 days., Disp: 7 tablet, Rfl: 0 .  omeprazole (PRILOSEC) 20 MG capsule, Take 20 mg by mouth daily. PM, Disp: , Rfl:  .  ranitidine (ZANTAC) 150 MG capsule, Take 150 mg by mouth 2 (two) times daily., Disp: , Rfl:  .  tiZANidine (ZANAFLEX) 4 MG tablet, Take 1 tablet (4 mg total) by mouth every 8 (eight) hours as needed for muscle spasms., Disp: 30 tablet, Rfl: 0  Allergies  Allergen Reactions  . Sulfa Antibiotics Anaphylaxis  . Penicillins Hives  . Septra [Sulfamethoxazole-Trimethoprim] Nausea And Vomiting     ROS  As noted in HPI.   Physical Exam  BP (!) 145/98 (BP Location: Left Arm)   Pulse 89   Temp 98.3 F (36.8 C) (Oral)   Resp 18   Ht 5\' 3"  (1.6 m)   Wt 96.2 kg   LMP 04/25/2015 (Approximate)   SpO2 98%   BMI 37.55 kg/m   Constitutional: Well developed, well nourished, no acute distress Eyes:  EOMI, conjunctiva normal bilaterally HENT: Normocephalic, atraumatic,mucus membranes moist Respiratory: Normal inspiratory effort, lungs clear bilaterally.  No bruising over the anterior chest.  Positive tenderness over the left lower anterior rib cage.  No crepitus.  No paradoxical chest wall movement. Cardiovascular: Normal rate GI: nondistended skin: No rash, skin intact Musculoskeletal: no deformities Neurologic: Alert & oriented x 3, no focal neuro deficits Psychiatric: Speech and behavior appropriate   ED Course   Medications - No data to display  Orders Placed This Encounter  Procedures  . DG Ribs Unilateral W/Chest Left    Standing Status:   Standing    Number of Occurrences:   1    Order Specific Question:   Reason for Exam (SYMPTOM  OR DIAGNOSIS REQUIRED)    Answer:   injury; pain    No results found for this or any previous visit (from the past 24 hour(s)). Dg Ribs Unilateral W/chest  Left  Result Date: 02/17/2019 CLINICAL DATA:  Injury.  Pain. EXAM: LEFT RIBS AND CHEST - 3+ VIEW COMPARISON:  Chest x-ray 11/04/2013. FINDINGS: Mediastinum hilar structures normal. Heart size normal. Lungs are clear of acute infiltrates. Right base calcified pulmonary nodule most likely granuloma. No pleural effusion or pneumothorax. No evidence of displaced rib fracture. IMPRESSION: 1.  No acute cardiopulmonary disease. 2.  No evidence of displaced rib fracture or pneumothorax. Electronically Signed   By: Marcello Moores  Register   On: 02/17/2019 16:11    ED Clinical Impression  1. Contusion of rib on left side, initial encounter      ED Assessment/Plan  Reviewed imaging independently.  No displaced rib fracture.  No pleural effusion, pneumothorax.  See radiology report for full details.  Suspect either hairline rib fracture or rib contusion.  Patient states the Aleve has not worked for her.  Home with Mobic 15 mg daily and Tylenol 1 g 4 times a day as needed for pain, Zanaflex.  Follow-up with PMD in 1 week if not better, to the ER if she gets worse.  Discussed imaging, MDM, treatment plan, and plan for follow-up with patient. Discussed sn/sx that should prompt return to the ED. patient agrees with plan.   Meds ordered this encounter  Medications  . meloxicam (MOBIC) 15 MG tablet    Sig: Take 1 tablet (15 mg total) by mouth daily for 7 days.    Dispense:  7 tablet    Refill:  0  . tiZANidine (ZANAFLEX) 4 MG tablet    Sig: Take 1 tablet (4 mg total) by mouth every 8 (eight) hours as needed for muscle spasms.    Dispense:  30 tablet    Refill:  0    *This clinic note was created using [employer]. Therefore, there may be occasional mistakes despite careful proofreading.   ?   Melynda Ripple, MD 02/17/19 1902

## 2019-02-17 NOTE — ED Triage Notes (Signed)
Pt c/o left rib pain. She states that she bent down to get something and felt something pull in the area. Occurred about 2 days ago. She states that it hurts for her to breathe. She has tried Aleve.

## 2019-02-17 NOTE — Discharge Instructions (Addendum)
Take the Mobic in the morning with 2 extra strength Tylenols (500 mg each).  You may take an additional 2 extra strength Tylenols 2 or 3 more times a day as needed.  Zanaflex for muscle spasms.

## 2019-03-14 ENCOUNTER — Other Ambulatory Visit: Payer: Self-pay | Admitting: Family Medicine

## 2019-03-15 ENCOUNTER — Other Ambulatory Visit: Payer: Self-pay | Admitting: Obstetrics and Gynecology

## 2019-03-15 DIAGNOSIS — N644 Mastodynia: Secondary | ICD-10-CM

## 2019-03-16 ENCOUNTER — Other Ambulatory Visit: Payer: Self-pay | Admitting: Family Medicine

## 2019-03-16 DIAGNOSIS — N611 Abscess of the breast and nipple: Secondary | ICD-10-CM

## 2019-03-28 ENCOUNTER — Ambulatory Visit
Admission: RE | Admit: 2019-03-28 | Discharge: 2019-03-28 | Disposition: A | Discharge status: Home / Self Care | Payer: BC Managed Care – PPO | Source: Ambulatory Visit | Attending: Family Medicine | Admitting: Family Medicine

## 2019-03-28 DIAGNOSIS — N611 Abscess of the breast and nipple: Secondary | ICD-10-CM | POA: Insufficient documentation

## 2019-11-09 ENCOUNTER — Other Ambulatory Visit: Payer: Self-pay | Admitting: Family Medicine

## 2019-11-09 DIAGNOSIS — Z780 Asymptomatic menopausal state: Secondary | ICD-10-CM

## 2020-05-01 ENCOUNTER — Other Ambulatory Visit: Payer: Self-pay | Admitting: Family Medicine

## 2020-05-01 DIAGNOSIS — R1011 Right upper quadrant pain: Secondary | ICD-10-CM

## 2020-05-06 ENCOUNTER — Telehealth: Payer: Self-pay | Admitting: Family Medicine

## 2020-12-28 ENCOUNTER — Ambulatory Visit
Admission: EM | Admit: 2020-12-28 | Discharge: 2020-12-28 | Disposition: A | Discharge status: Home / Self Care | Payer: BC Managed Care – PPO | Attending: Emergency Medicine | Admitting: Emergency Medicine

## 2020-12-28 ENCOUNTER — Other Ambulatory Visit: Payer: Self-pay

## 2020-12-28 ENCOUNTER — Encounter: Payer: Self-pay | Admitting: Emergency Medicine

## 2020-12-28 DIAGNOSIS — J069 Acute upper respiratory infection, unspecified: Secondary | ICD-10-CM | POA: Insufficient documentation

## 2020-12-28 LAB — POCT RAPID STREP A: Streptococcus, Group A Screen (Direct): NEGATIVE

## 2020-12-28 MED ORDER — IPRATROPIUM BROMIDE 0.06 % NA SOLN: 2.0000 | Freq: Four times a day (QID) | NASAL | 12 refills | Status: AC

## 2020-12-28 MED ORDER — PROMETHAZINE-DM 6.25-15 MG/5ML PO SYRP: 5.0000 mL | ORAL_SOLUTION | Freq: Four times a day (QID) | ORAL | 0 refills | Status: AC | PRN

## 2020-12-28 MED ORDER — BENZONATATE 100 MG PO CAPS
200.0000 mg | ORAL_CAPSULE | Freq: Three times a day (TID) | ORAL | 0 refills | Status: AC
Start: 2020-12-28 — End: ?

## 2020-12-28 NOTE — Discharge Instructions (Addendum)

## 2020-12-28 NOTE — ED Triage Notes (Signed)
Patient bilateral watery eyes, sore throat  that started yesterday. Patient denies fevers.

## 2020-12-28 NOTE — ED Provider Notes (Signed)
[facility]    CSN: [id number] Arrival date & time: 12/28/20  0847      History   Chief Complaint Chief Complaint  Patient presents with   Eye Problem    bilateral   Sore Throat    HPI Mariah Espinoza is a 55 y.o. female.   HPI  55 year old female here for evaluation of sore throat and eye drainage.  Patient reports that she developed watery eye drainage from both eyes yesterday and she is continuing to have some watery discharge from left eye as well as some itching.  She has had nasal congestion but only the left and reports that it hurts to swallow only on the left.  She has had an occasional nonproductive cough and she reports that yesterday she felt little short of breath but that is resolved today.  She denies any changes in vision, runny nose, ear pain, wheezing, or GI complaints.  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   Esophagitis, reflux 05/09/2015   H/O disease 05/09/2015   Acne inversa 05/09/2015   Hypomagnesemia 10/31/2014   Adult BMI 30+ 10/31/2014   B12 deficiency 10/31/2014   Avitaminosis D 10/31/2014   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      Home Medications    Prior to Admission medications   Medication Sig Start Date End Date Taking? Authorizing Provider  benzonatate (TESSALON) 100 MG capsule Take 2 capsules (200 mg total) by mouth every 8 (eight) hours. 12/28/20  Yes Becky Augusta, NP  Cholecalciferol 25 MCG (1000 UT) capsule Take by mouth. 10/16/15  Yes [provider]  cyanocobalamin (,VITAMIN B-12,) 1000 MCG/ML injection Inject into the muscle. 09/11/20 09/06/21 Yes [provider]  ipratropium (ATROVENT) 0.06 % nasal spray Place 2 sprays into both nostrils 4 (four) times daily. 12/28/20  Yes Becky Augusta, NP  omeprazole (PRILOSEC) 20 MG capsule Take 20 mg by mouth daily. PM   Yes [provider]  promethazine-dextromethorphan (PROMETHAZINE-DM) 6.25-15 MG/5ML syrup Take 5 mLs by mouth 4 (four) times daily as needed. 12/28/20  Yes Becky Augusta, NP  rosuvastatin (CRESTOR) 10 MG tablet Take by mouth. 09/11/20 09/11/21 Yes [provider]  tiZANidine (ZANAFLEX) 4 MG tablet Take 1 tablet (4 mg total) by mouth every 8 (eight) hours as needed for muscle spasms. 02/17/19  Yes Domenick Gong, MD    Family  History Family History  Problem Relation Age of Onset   Breast cancer Maternal Aunt     Social History Social History   Tobacco Use   Smoking status: Every Day    Packs/day: 0.25    Years: 30.00    Pack years: 7.50    Types: Cigarettes   Smokeless tobacco: Never  Vaping Use   Vaping Use: Never used  Substance Use Topics   Alcohol use: Yes    Alcohol/week: 7.0 standard drinks    Types: 7 Glasses of wine per week    Comment: socially-   Drug use: No     Allergies   Sulfa antibiotics, Penicillins, and Septra  [sulfamethoxazole-trimethoprim]   Review of Systems Review of Systems  Constitutional:  Negative for activity change, appetite change and fever.  HENT:  Positive for congestion and sore throat. Negative for ear pain and rhinorrhea.   Eyes:  Positive for discharge and itching. Negative for photophobia, pain, redness and visual disturbance.  Respiratory:  Positive for cough and shortness of breath. Negative for wheezing.   Gastrointestinal:  Negative for diarrhea, nausea and vomiting.  Musculoskeletal:  Negative for arthralgias and myalgias.  Skin:  Negative for rash.  Hematological: Negative.   Psychiatric/Behavioral: Negative.      Physical Exam Triage Vital Signs ED Triage Vitals  Enc Vitals Group     BP 12/28/20 0901 116/84     Pulse Rate 12/28/20 0901 (!) 112     Resp 12/28/20 0901 14     Temp 12/28/20 0901 98.8 F (37.1 C)     Temp Source 12/28/20 0901 Oral     SpO2 12/28/20 0901 95 %     Weight 12/28/20 0857 198 lb (89.8 kg)     Height 12/28/20 0857 5\' 3"  (1.6 m)     Head Circumference --      Peak Flow --      Pain Score 12/28/20 0857 2     Pain Loc --      Pain Edu? --      Excl. in GC? --    No data found.  Updated Vital Signs BP 116/84 (BP Location: Left Arm)   Pulse (!) 112   Temp 98.8 F (37.1 C) (Oral)   Resp 14   Ht 5\' 3"  (1.6 m)   Wt 198 lb (89.8 kg)   LMP 04/25/2015 (Approximate)   SpO2 95%   BMI 35.07 kg/m   Visual Acuity Right Eye Distance:   Left Eye Distance:   Bilateral Distance:    Right Eye Near:   Left Eye Near:    Bilateral Near:     Physical Exam Vitals and nursing note reviewed.  Constitutional:      General: She is not in acute distress.    Appearance: Normal appearance. She is obese. She is not ill-appearing.  HENT:     Head: Normocephalic and atraumatic.     Right Ear: Tympanic membrane, ear canal and external ear normal. There is no impacted cerumen.     Left Ear: Tympanic membrane, ear canal and external ear normal.  There is no impacted cerumen.     Nose: Congestion present. No rhinorrhea.     Mouth/Throat:     Mouth: Mucous membranes are moist.     Pharynx: Oropharynx is clear. Posterior oropharyngeal erythema present. No oropharyngeal exudate.  Eyes:     General:        Right eye: No discharge.        Left  eye: Discharge present.    Extraocular Movements: Extraocular movements intact.     Conjunctiva/sclera: Conjunctivae normal.     Pupils: Pupils are equal, round, and reactive to light.     Comments: Patient has tearing from left eye.  Bilateral conjunctiva are unremarkable.  Cardiovascular:     Rate and Rhythm: Normal rate and regular rhythm.     Pulses: Normal pulses.     Heart sounds: Normal heart sounds. No murmur heard.   No gallop.  Pulmonary:     Effort: Pulmonary effort is normal.     Breath sounds: Normal breath sounds. No wheezing, rhonchi or rales.  Musculoskeletal:     Cervical back: Normal range of motion and neck supple.  Lymphadenopathy:     Cervical: Cervical adenopathy present.  Skin:    General: Skin is warm and dry.     Capillary Refill: Capillary refill takes less than 2 seconds.     Findings: No erythema or rash.  Neurological:     General: No focal deficit present.     Mental Status: She is alert and oriented to person, place, and time.  Psychiatric:        Mood and Affect: Mood normal.        Behavior: Behavior normal.        Thought Content: Thought content normal.        Judgment: Judgment normal.     UC Treatments / Results  Labs (all labs ordered are listed, but only abnormal results are displayed) Labs Reviewed  CULTURE, GROUP A STREP [facility])  POCT RAPID STREP A, ED / UC  POCT RAPID STREP A    EKG   Radiology No results found.  Procedures Procedures (including critical care time)  Medications Ordered in UC Medications - No data to display  Initial Impression / Assessment and Plan / UC Course  I have reviewed the triage vital signs and  the nursing notes.  Pertinent labs & imaging results that were available during my care of the patient were reviewed by me and considered in my medical decision making (see chart for details).  Appearing 55 year old female here for evaluation of sore throat and bilateral watery eye drainage that started yesterday and has been associated with upper respiratory symptoms.  Her symptoms consist of nasal congestion on the left side of her nose only, sore throat on the left side when swallowing only, occasional nonproductive cough, and shortness of breath.  Patient's physical exam reveals pearly gray tympanic membranes bilaterally with a normal light reflex and clear external auditory canals.  Bilateral bulbar and bilateral conjunctiva are unremarkable.  There is some watery discharge from the inner canthus of the left eye.  There is no drainage on the lashes..  Nasal mucosa is pink and moist and there is mild edema to the left naris mucosa.  Right naris patent.  Oropharyngeal exam reveals erythema to the uvula and the soft palate on both sides.  Patient's tonsillar pillars are large with +1 edema but pink and free of exudate.  Patient does have anterior cervical lymphadenopathy bilaterally.  Cardiopulmonary exam reveals clear lung sounds in all fields.  Will swab patient for strep given the erythema to the uvula and surrounding tissue.  Rapid strep is negative.  Will send swab for culture.  Will discharge patient home with a diagnosis of viral URI with cough.  I will prescribe Atrovent nasal spray to be nasal congestion and Tessalon Perles and Promethazine DM cough syrup to help with  cough.  Patient to use over-the-counter antihistamine eyedrops such as Opcon-A for the watery discharge from her eyes.  Patient has not seen an eye doctor and I recommended that she see 1 as her watery eyes, which she says she has had separate from this current issue, may be a result of ocular inflammation and need further evaluation  by ophthalmology.   Final Clinical Impressions(s) / UC Diagnoses   Final diagnoses:  Upper respiratory tract infection, unspecified type     Discharge Instructions      Use the Atrovent nasal spray, 2 squirts in each nostril every 6 hours, as needed for runny nose and postnasal drip.  Use the Tessalon Perles every 8 hours during the day.  Take them with a small sip of water.  They may give you some numbness to the base of your tongue or a metallic taste in your mouth, this is normal.  Use the Promethazine DM cough syrup at bedtime for cough and congestion.  It will make you drowsy so do not take it during the day.  Return for reevaluation or see your primary care provider for any new or worsening symptoms.      ED Prescriptions     Medication Sig Dispense Auth. Provider   benzonatate (TESSALON) 100 MG capsule Take 2 capsules (200 mg total) by mouth every 8 (eight) hours. 21 capsule Becky Augusta, NP   ipratropium (ATROVENT) 0.06 % nasal spray Place 2 sprays into both nostrils 4 (four) times daily. 15 mL Becky Augusta, NP   promethazine-dextromethorphan (PROMETHAZINE-DM) 6.25-15 MG/5ML syrup Take 5 mLs by mouth 4 (four) times daily as needed. 118 mL Becky Augusta, NP      PDMP not reviewed this encounter.   Becky Augusta, NP 12/28/20 [phone]

## 2020-12-31 LAB — CULTURE, GROUP A STREP (THRC)

## 2021-04-28 ENCOUNTER — Other Ambulatory Visit: Payer: Self-pay | Admitting: Family Medicine

## 2021-04-28 DIAGNOSIS — Z1231 Encounter for screening mammogram for malignant neoplasm of breast: Secondary | ICD-10-CM

## 2021-06-10 ENCOUNTER — Other Ambulatory Visit: Payer: Self-pay

## 2021-06-10 ENCOUNTER — Ambulatory Visit
Admission: RE | Admit: 2021-06-10 | Discharge: 2021-06-10 | Disposition: A | Discharge status: Home / Self Care | Payer: BC Managed Care – PPO | Source: Ambulatory Visit | Attending: Family Medicine | Admitting: Family Medicine

## 2021-06-10 DIAGNOSIS — Z1231 Encounter for screening mammogram for malignant neoplasm of breast: Secondary | ICD-10-CM | POA: Insufficient documentation

## 2022-06-30 ENCOUNTER — Other Ambulatory Visit: Payer: Self-pay | Admitting: Family Medicine

## 2022-06-30 DIAGNOSIS — Z1231 Encounter for screening mammogram for malignant neoplasm of breast: Secondary | ICD-10-CM

## 2022-06-30 DIAGNOSIS — Z780 Asymptomatic menopausal state: Secondary | ICD-10-CM

## 2022-07-14 ENCOUNTER — Ambulatory Visit
Admission: RE | Admit: 2022-07-14 | Discharge: 2022-07-14 | Disposition: A | Discharge status: Home / Self Care | Payer: BC Managed Care – PPO | Source: Ambulatory Visit | Attending: Family Medicine | Admitting: Family Medicine

## 2022-07-14 DIAGNOSIS — Z780 Asymptomatic menopausal state: Secondary | ICD-10-CM

## 2022-07-14 DIAGNOSIS — Z1231 Encounter for screening mammogram for malignant neoplasm of breast: Secondary | ICD-10-CM | POA: Insufficient documentation

## 2023-05-28 ENCOUNTER — Other Ambulatory Visit: Payer: Self-pay | Admitting: Family Medicine

## 2023-05-28 DIAGNOSIS — Z780 Asymptomatic menopausal state: Secondary | ICD-10-CM

## 2023-05-28 DIAGNOSIS — Z1231 Encounter for screening mammogram for malignant neoplasm of breast: Secondary | ICD-10-CM

## 2023-07-15 ENCOUNTER — Ambulatory Visit
Admission: RE | Admit: 2023-07-15 | Discharge: 2023-07-15 | Disposition: A | Discharge status: Home / Self Care | Payer: Self-pay | Source: Ambulatory Visit | Attending: Family Medicine | Admitting: Family Medicine

## 2023-07-15 DIAGNOSIS — Z1231 Encounter for screening mammogram for malignant neoplasm of breast: Secondary | ICD-10-CM | POA: Diagnosis present

## 2023-07-21 ENCOUNTER — Other Ambulatory Visit: Payer: Self-pay | Admitting: Family Medicine

## 2023-07-21 DIAGNOSIS — Z0000 Encounter for general adult medical examination without abnormal findings: Secondary | ICD-10-CM

## 2023-07-21 DIAGNOSIS — R634 Abnormal weight loss: Secondary | ICD-10-CM

## 2023-08-04 ENCOUNTER — Ambulatory Visit
Admission: RE | Admit: 2023-08-04 | Discharge: 2023-08-04 | Disposition: A | Discharge status: Home / Self Care | Source: Ambulatory Visit | Attending: Family Medicine | Admitting: Family Medicine

## 2023-08-04 DIAGNOSIS — R634 Abnormal weight loss: Secondary | ICD-10-CM | POA: Diagnosis present

## 2023-08-04 DIAGNOSIS — Z0000 Encounter for general adult medical examination without abnormal findings: Secondary | ICD-10-CM | POA: Insufficient documentation

## 2023-08-04 MED ORDER — IOHEXOL 300 MG/ML  SOLN
100.0000 mL | Freq: Once | INTRAMUSCULAR | Status: AC | PRN
  Administered 2023-08-04: 100 mL via INTRAVENOUS

## 2023-08-26 ENCOUNTER — Ambulatory Visit
Admission: RE | Admit: 2023-08-26 | Discharge: 2023-08-26 | Disposition: A | Discharge status: Home / Self Care | Source: Ambulatory Visit | Attending: Family Medicine | Admitting: Family Medicine

## 2023-08-26 DIAGNOSIS — Z780 Asymptomatic menopausal state: Secondary | ICD-10-CM | POA: Diagnosis present

## 2023-09-28 ENCOUNTER — Encounter: Payer: Self-pay | Admitting: *Deleted

## 2023-10-18 ENCOUNTER — Encounter
Admission: RE | Disposition: A | Discharge status: Home / Self Care | Payer: Self-pay | Source: Home / Self Care | Attending: Gastroenterology

## 2023-10-18 ENCOUNTER — Ambulatory Visit: Admitting: Certified Registered"

## 2023-10-18 ENCOUNTER — Ambulatory Visit
Admission: RE | Admit: 2023-10-18 | Discharge: 2023-10-18 | Disposition: A | Discharge status: Home / Self Care | Attending: Gastroenterology | Admitting: Gastroenterology

## 2023-10-18 ENCOUNTER — Other Ambulatory Visit: Payer: Self-pay

## 2023-10-18 DIAGNOSIS — F17200 Nicotine dependence, unspecified, uncomplicated: Secondary | ICD-10-CM | POA: Insufficient documentation

## 2023-10-18 DIAGNOSIS — K2950 Unspecified chronic gastritis without bleeding: Secondary | ICD-10-CM | POA: Insufficient documentation

## 2023-10-18 DIAGNOSIS — K449 Diaphragmatic hernia without obstruction or gangrene: Secondary | ICD-10-CM | POA: Insufficient documentation

## 2023-10-18 DIAGNOSIS — D125 Benign neoplasm of sigmoid colon: Secondary | ICD-10-CM | POA: Insufficient documentation

## 2023-10-18 DIAGNOSIS — R1084 Generalized abdominal pain: Secondary | ICD-10-CM | POA: Insufficient documentation

## 2023-10-18 DIAGNOSIS — K5730 Diverticulosis of large intestine without perforation or abscess without bleeding: Secondary | ICD-10-CM | POA: Diagnosis not present

## 2023-10-18 DIAGNOSIS — K219 Gastro-esophageal reflux disease without esophagitis: Secondary | ICD-10-CM | POA: Insufficient documentation

## 2023-10-18 DIAGNOSIS — D120 Benign neoplasm of cecum: Secondary | ICD-10-CM | POA: Diagnosis not present

## 2023-10-18 DIAGNOSIS — D123 Benign neoplasm of transverse colon: Secondary | ICD-10-CM | POA: Insufficient documentation

## 2023-10-18 DIAGNOSIS — R634 Abnormal weight loss: Secondary | ICD-10-CM | POA: Insufficient documentation

## 2023-10-18 DIAGNOSIS — K3189 Other diseases of stomach and duodenum: Secondary | ICD-10-CM | POA: Diagnosis not present

## 2023-10-18 HISTORY — PX: ESOPHAGOGASTRODUODENOSCOPY: SHX5428

## 2023-10-18 HISTORY — PX: COLONOSCOPY: SHX5424

## 2023-10-18 HISTORY — PX: POLYPECTOMY: SHX149

## 2023-10-18 HISTORY — PX: HEMOSTASIS CLIP PLACEMENT: SHX6857

## 2023-10-18 SURGERY — COLONOSCOPY
Anesthesia: General

## 2023-10-18 MED ORDER — LIDOCAINE HCL (CARDIAC) PF 100 MG/5ML IV SOSY
PREFILLED_SYRINGE | INTRAVENOUS | Status: DC | PRN
  Administered 2023-10-18: 60 mg via INTRAVENOUS

## 2023-10-18 MED ORDER — PROPOFOL 500 MG/50ML IV EMUL
INTRAVENOUS | Status: DC | PRN
  Administered 2023-10-18: 140 ug/kg/min via INTRAVENOUS

## 2023-10-18 MED ORDER — PROPOFOL 10 MG/ML IV BOLUS
INTRAVENOUS | Status: DC | PRN
  Administered 2023-10-18: 80 mg via INTRAVENOUS

## 2023-10-18 MED ORDER — SODIUM CHLORIDE 0.9 % IV SOLN: INTRAVENOUS | Status: DC

## 2023-10-18 NOTE — Anesthesia Preprocedure Evaluation (Signed)
 Anesthesia Evaluation  Patient identified by MRN, date of birth, ID band Patient awake    Reviewed: Allergy & Precautions, H&P , NPO status , Patient's Chart, lab work & pertinent test results, reviewed documented beta blocker date and time   History of Anesthesia Complications Negative for: history of anesthetic complications  Airway Mallampati: I  TM Distance: >3 FB Neck ROM: full    Dental  (+) Dental Advidsory Given, Chipped, Missing, Teeth Intact   Pulmonary neg shortness of breath, neg sleep apnea, neg COPD, neg recent URI, Current Smoker and Patient abstained from smoking.   Pulmonary exam normal breath sounds clear to auscultation       Cardiovascular Exercise Tolerance: Good negative cardio ROS Normal cardiovascular exam Rhythm:regular Rate:Normal     Neuro/Psych negative neurological ROS  negative psych ROS   GI/Hepatic Neg liver ROS,GERD  ,,  Endo/Other  negative endocrine ROS    Renal/GU negative Renal ROS  negative genitourinary   Musculoskeletal   Abdominal   Peds  Hematology negative hematology ROS (+)   Anesthesia Other Findings Past Medical History: No date: GERD (gastroesophageal reflux disease) No date: Hearing loss in left ear No date: Hx of colonic polyp No date: Neuromuscular disorder (HCC)     Comment:  NUMBNESS IN ARMS,HANDS AND FEET/ EXPERIENCING LEG CRAMPS No date: Shortness of breath dyspnea No date: Vitamin B 12 deficiency No date: Vitamin D deficiency   Reproductive/Obstetrics negative OB ROS                              Anesthesia Physical Anesthesia Plan  ASA: 2  Anesthesia Plan: General   Post-op Pain Management:    Induction: Intravenous  PONV Risk Score and Plan: 2 and Propofol  infusion and TIVA  Airway Management Planned: Natural Airway and Nasal Cannula  Additional Equipment:   Intra-op Plan:   Post-operative Plan:   Informed  Consent: I have reviewed the patients History and Physical, chart, labs and discussed the procedure including the risks, benefits and alternatives for the proposed anesthesia with the patient or authorized representative who has indicated his/her understanding and acceptance.     Dental Advisory Given  Plan Discussed with: Anesthesiologist, CRNA and Surgeon  Anesthesia Plan Comments:         Anesthesia Quick Evaluation

## 2023-10-18 NOTE — Transfer of Care (Signed)
 Immediate Anesthesia Transfer of Care Note  Patient: Mariah Espinoza  Procedure(s) Performed: COLONOSCOPY EGD (ESOPHAGOGASTRODUODENOSCOPY) POLYPECTOMY, INTESTINE CONTROL OF HEMORRHAGE, GI TRACT, ENDOSCOPIC, BY CLIPPING OR OVERSEWING  Patient Location: Endoscopy Unit  Anesthesia Type:General  Level of Consciousness: drowsy  Airway & Oxygen Therapy: Patient Spontanous Breathing  Post-op Assessment: Report given to RN  Post vital signs: stable  Last Vitals:  Vitals Value Taken Time  BP    Temp    Pulse    Resp    SpO2      Last Pain:  Vitals:   10/18/23 0923  TempSrc: Temporal  PainSc: 0-No pain         Complications: No notable events documented.

## 2023-10-18 NOTE — Interval H&P Note (Signed)
 History and Physical Interval Note:  10/18/2023 10:02 AM  Mariah Espinoza Specking  has presented today for surgery, with the diagnosis of Generalized abdominal pain (R10.84) Irregular bowel habits (R19.8) Weight loss (R63.4) Gastroesophageal reflux disease, unspecified whether esophagitis present (K21.9).  The various methods of treatment have been discussed with the patient and family. After consideration of risks, benefits and other options for treatment, the patient has consented to  Procedure(s): COLONOSCOPY (N/A) EGD (ESOPHAGOGASTRODUODENOSCOPY) (N/A) as a surgical intervention.  The patient's history has been reviewed, patient examined, no change in status, stable for surgery.  I have reviewed the patient's chart and labs.  Questions were answered to the patient's satisfaction.     Ole ONEIDA Schick  Ok to proceed with EGD/Colonoscopy

## 2023-10-18 NOTE — H&P (Signed)
 Outpatient short stay form Pre-procedure 10/18/2023  Mariah Espinoza Schick, MD  Primary Physician: Zachary Idelia LABOR, MD  Reason for visit:  Abdominal pain/Change in bowel habits/Bloating  History of present illness:    58 y/o lady with history of GERD and IBS here for EGD/Colonoscopy for abdominal pain, constipation, and excessive flatulence/bloating. Last colonoscopy was in 2017 and was normal. No blood thinners. No family history of GI malignancies. History of tubal ligation.    Current Facility-Administered Medications:    0.9 %  sodium chloride  infusion, , Intravenous, Continuous, Grainne Knights, Mariah ONEIDA, MD, Last Rate: 20 mL/hr at 10/18/23 0926, New Bag at 10/18/23 0926  Medications Prior to Admission  Medication Sig Dispense Refill Last Dose/Taking   Cholecalciferol 25 MCG (1000 UT) capsule Take by mouth.   Past Week   ipratropium (ATROVENT ) 0.06 % nasal spray Place 2 sprays into both nostrils 4 (four) times daily. 15 mL 12 Past Week   omeprazole (PRILOSEC) 20 MG capsule Take 20 mg by mouth daily. PM   Past Week   tiZANidine  (ZANAFLEX ) 4 MG tablet Take 1 tablet (4 mg total) by mouth every 8 (eight) hours as needed for muscle spasms. 30 tablet 0 Past Week   benzonatate  (TESSALON ) 100 MG capsule Take 2 capsules (200 mg total) by mouth every 8 (eight) hours. 21 capsule 0    promethazine -dextromethorphan (PROMETHAZINE -DM) 6.25-15 MG/5ML syrup Take 5 mLs by mouth 4 (four) times daily as needed. 118 mL 0    rosuvastatin (CRESTOR) 10 MG tablet Take by mouth.        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    Physical Exam  Gen: Alert, oriented. Appears stated age.  HEENT: PERRLA. Lungs: No respiratory distress CV: RRR Abd: soft, benign, no masses Ext: No edema    Planned procedures: Proceed with EGD/colonoscopy. The patient understands the nature of the planned procedure, indications, risks, alternatives and potential complications including but not limited to bleeding, infection, perforation, damage to internal organs and possible oversedation/side effects from anesthesia. The patient agrees and gives consent to proceed.  Please refer to procedure notes for findings, recommendations and patient disposition/instructions.     Mariah Espinoza Schick, MD [facility]

## 2023-10-18 NOTE — Op Note (Signed)
 [facility] Patient Name: Mariah Espinoza Procedure Date: 10/18/2023 10:03 AM MRN: [id number] Account #: [id number] Date of Birth: 01/26/1966 Admit Type: Outpatient Age: 58 Room: [facility] ENDO ROOM 3 Gender: Female Note Status: Finalized Instrument Name: [id number] Procedure:             Colonoscopy Indications:           Generalized abdominal pain, Change in bowel habits,                         Weight loss Providers:             Ole Schick MD, MD Referring MD:          Idelia LABOR. Zachary MD, MD (Referring MD) Medicines:             Monitored Anesthesia Care Complications:         No immediate complications. Estimated blood loss:                         Minimal. Procedure:             Pre-Anesthesia Assessment:                        - Prior to the procedure, a History and Physical was                         performed, and patient medications and allergies were                         reviewed. The patient is competent. The risks and                         benefits of the procedure and the sedation options and                         risks were discussed with the patient. All questions                         were answered and informed consent was obtained.                         Patient identification and proposed procedure were                         verified by the physician, the nurse, the                         anesthesiologist, the anesthetist and the technician                         in the endoscopy suite. Mental Status Examination:                         alert and oriented. Airway Examination: normal                         oropharyngeal airway and neck mobility. Respiratory  Examination: clear to auscultation. CV Examination:                         normal. Prophylactic Antibiotics: The patient does not                         require prophylactic antibiotics. Prior                          Anticoagulants: The patient has taken no anticoagulant                         or antiplatelet agents. ASA Grade Assessment: II - A                         patient with mild systemic disease. After reviewing                         the risks and benefits, the patient was deemed in                         satisfactory condition to undergo the procedure. The                         anesthesia plan was to use monitored anesthesia care                         (MAC). Immediately prior to administration of                         medications, the patient was re-assessed for adequacy                         to receive sedatives. The heart rate, respiratory                         rate, oxygen saturations, blood pressure, adequacy of                         pulmonary ventilation, and response to care were                         monitored throughout the procedure. The physical                         status of the patient was re-assessed after the                         procedure.                        After obtaining informed consent, the colonoscope was                         passed under direct vision. Throughout the procedure,                         the patient's blood pressure, pulse, and oxygen  saturations were monitored continuously. The                         Colonoscope was introduced through the anus and                         advanced to the the cecum, identified by appendiceal                         orifice and ileocecal valve. The colonoscopy was                         performed without difficulty. The patient tolerated                         the procedure well. The quality of the bowel                         preparation was adequate to identify polyps. The                         ileocecal valve, appendiceal orifice, and rectum were                         photographed. Findings:      The perianal and digital rectal examinations were normal.      A  1 mm polyp was found in the cecum. The polyp was sessile. The polyp       was removed with a jumbo cold forceps. Resection and retrieval were       complete. Estimated blood loss was minimal.      A 4 mm polyp was found in the transverse colon. The polyp was sessile.       The polyp was removed with a cold snare. Resection and retrieval were       complete. Estimated blood loss was minimal.      A 1 mm polyp was found in the distal transverse colon. The polyp was       sessile. The polyp was removed with a jumbo cold forceps. Resection and       retrieval were complete. Estimated blood loss was minimal.      A 10 mm polyp was found in the sigmoid colon. The polyp was       pedunculated. The polyp was removed with a hot snare. Resection and       retrieval were complete. To prevent bleeding after the polypectomy, one       hemostatic clip was successfully placed. There was no bleeding during,       or at the end, of the procedure.      A few small-mouthed diverticula were found in the sigmoid colon.      The exam was otherwise without abnormality on direct and retroflexion       views. Impression:            - One 1 mm polyp in the cecum, removed with a jumbo                         cold forceps. Resected and retrieved.                        -  One 4 mm polyp in the transverse colon, removed with                         a cold snare. Resected and retrieved.                        - One 1 mm polyp in the distal transverse colon,                         removed with a jumbo cold forceps. Resected and                         retrieved.                        - One 10 mm polyp in the sigmoid colon, removed with a                         hot snare. Resected and retrieved. Clip was placed.                        - Diverticulosis in the sigmoid colon.                        - The examination was otherwise normal on direct and                         retroflexion views. Recommendation:        -  Discharge patient to home.                        - Resume previous diet.                        - Continue present medications.                        - Await pathology results.                        - Repeat colonoscopy in 3 years for surveillance.                        - Return to referring physician as previously                         scheduled. Procedure Code(s):     --- Professional ---                        [phone], Colonoscopy, flexible; with removal of                         tumor(s), polyp(s), or other lesion(s) by snare                         technique                        [id number], 59, Colonoscopy, flexible; with biopsy, single  or multiple Diagnosis Code(s):     --- Professional ---                        D12.0, Benign neoplasm of cecum                        D12.3, Benign neoplasm of transverse colon (hepatic                         flexure or splenic flexure)                        D12.5, Benign neoplasm of sigmoid colon                        R10.84, Generalized abdominal pain                        R19.4, Change in bowel habit                        R63.4, Abnormal weight loss                        K57.30, Diverticulosis of large intestine without                         perforation or abscess without bleeding CPT copyright 2022 American [employer] Association. All rights reserved. The codes documented in this report are preliminary and upon coder review may  be revised to meet current compliance requirements. Ole Schick MD, MD 10/18/2023 10:46:21 AM Number of Addenda: 0 Note Initiated On: 10/18/2023 10:03 AM Scope Withdrawal Time: 0 hours 12 minutes 21 seconds  Total Procedure Duration: 0 hours 17 minutes 31 seconds  Estimated Blood Loss:  Estimated blood loss was minimal.      [facility]

## 2023-10-18 NOTE — Op Note (Signed)
 [facility] Patient Name: Mariah Espinoza Procedure Date: 10/18/2023 10:04 AM MRN: [id number] Account #: [id number] Date of Birth: 10-23-1965 Admit Type: Outpatient Age: 58 Room: [facility] ENDO ROOM 3 Gender: Female Note Status: Finalized Instrument Name: [id number] [id number] Procedure:             Upper GI endoscopy Indications:           Generalized abdominal pain, Abdominal bloating, Weight                         loss Providers:             Ole Schick MD, MD Referring MD:          Idelia LABOR. Zachary MD, MD (Referring MD) Medicines:             Monitored Anesthesia Care Complications:         No immediate complications. Estimated blood loss:                         Minimal. Procedure:             Pre-Anesthesia Assessment:                        - Prior to the procedure, a History and Physical was                         performed, and patient medications and allergies were                         reviewed. The patient is competent. The risks and                         benefits of the procedure and the sedation options and                         risks were discussed with the patient. All questions                         were answered and informed consent was obtained.                         Patient identification and proposed procedure were                         verified by the physician, the nurse, the                         anesthesiologist, the anesthetist and the technician                         in the endoscopy suite. Mental Status Examination:                         alert and oriented. Airway Examination: normal                         oropharyngeal airway and neck mobility. Respiratory  Examination: clear to auscultation. CV Examination:                         normal. Prophylactic Antibiotics: The patient does not                         require prophylactic antibiotics. Prior                          Anticoagulants: The patient has taken no anticoagulant                         or antiplatelet agents. ASA Grade Assessment: II - A                         patient with mild systemic disease. After reviewing                         the risks and benefits, the patient was deemed in                         satisfactory condition to undergo the procedure. The                         anesthesia plan was to use monitored anesthesia care                         (MAC). Immediately prior to administration of                         medications, the patient was re-assessed for adequacy                         to receive sedatives. The heart rate, respiratory                         rate, oxygen saturations, blood pressure, adequacy of                         pulmonary ventilation, and response to care were                         monitored throughout the procedure. The physical                         status of the patient was re-assessed after the                         procedure.                        After obtaining informed consent, the endoscope was                         passed under direct vision. Throughout the procedure,                         the patient's blood pressure, pulse, and oxygen  saturations were monitored continuously. The Endoscope                         was introduced through the mouth, and advanced to the                         second part of duodenum. The upper GI endoscopy was                         accomplished without difficulty. The patient tolerated                         the procedure well. Findings:      A small hiatal hernia was present.      The entire examined stomach was normal. Biopsies were taken with a cold       forceps for Helicobacter pylori testing. Estimated blood loss was       minimal.      Patchy mildly erythematous mucosa without active bleeding and with no       stigmata of bleeding was found in the duodenal  bulb. Impression:            - Small hiatal hernia.                        - Normal stomach. Biopsied.                        - Erythematous duodenopathy. Recommendation:        - Discharge patient to home.                        - Resume previous diet.                        - Continue present medications.                        - Await pathology results.                        - Return to referring physician as previously                         scheduled. Procedure Code(s):     --- Professional ---                        [phone], Esophagogastroduodenoscopy, flexible,                         transoral; with biopsy, single or multiple Diagnosis Code(s):     --- Professional ---                        K44.9, Diaphragmatic hernia without obstruction or                         gangrene                        K31.89, Other diseases of stomach and duodenum  R10.84, Generalized abdominal pain                        R14.0, Abdominal distension (gaseous)                        R63.4, Abnormal weight loss CPT copyright 2022 American Medical Association. All rights reserved. The codes documented in this report are preliminary and upon coder review may  be revised to meet current compliance requirements. Ole Schick MD, MD 10/18/2023 10:38:44 AM Number of Addenda: 0 Note Initiated On: 10/18/2023 10:04 AM Estimated Blood Loss:  Estimated blood loss was minimal.      [facility]

## 2023-10-19 LAB — SURGICAL PATHOLOGY

## 2023-10-19 NOTE — Anesthesia Postprocedure Evaluation (Signed)
 Anesthesia Post Note  Patient: Mariah Espinoza  Procedure(s) Performed: COLONOSCOPY EGD (ESOPHAGOGASTRODUODENOSCOPY) POLYPECTOMY, INTESTINE CONTROL OF HEMORRHAGE, GI TRACT, ENDOSCOPIC, BY CLIPPING OR OVERSEWING  Patient location during evaluation: Endoscopy Anesthesia Type: General Level of consciousness: awake and alert Pain management: pain level controlled Vital Signs Assessment: post-procedure vital signs reviewed and stable Respiratory status: spontaneous breathing, nonlabored ventilation, respiratory function stable and patient connected to nasal cannula oxygen Cardiovascular status: blood pressure returned to baseline and stable Postop Assessment: no apparent nausea or vomiting Anesthetic complications: no   No notable events documented.   Last Vitals:  Vitals:   10/18/23 1048 10/18/23 1058  BP: 105/63 119/87  Pulse: 88 84  Resp: 16 20  Temp:    SpO2: 98% 98%    Last Pain:  Vitals:   10/18/23 1058  TempSrc:   PainSc: 0-No pain                 Prentice Murphy
